# Patient Record
Sex: Female | Born: 1965 | Hispanic: No | Marital: Single | Smoking: Never smoker
Health system: Southern US, Community
[De-identification: ages and names within clinical notes are randomized; demographics above are authoritative.]

## PROBLEM LIST (undated history)

## (undated) DIAGNOSIS — H409 Unspecified glaucoma: Secondary | ICD-10-CM

## (undated) DIAGNOSIS — R109 Unspecified abdominal pain: Secondary | ICD-10-CM

## (undated) DIAGNOSIS — H5461 Unqualified visual loss, right eye, normal vision left eye: Secondary | ICD-10-CM

## (undated) HISTORY — DX: Unspecified abdominal pain: R10.9

## (undated) HISTORY — DX: Unqualified visual loss, right eye, normal vision left eye: H54.61

## (undated) HISTORY — PX: GLAUCOMA SURGERY: SHX656

## (undated) HISTORY — DX: Unspecified glaucoma: H40.9

---

## 2015-11-26 ENCOUNTER — Encounter: Payer: Self-pay | Admitting: Student

## 2015-11-26 ENCOUNTER — Ambulatory Visit (INDEPENDENT_AMBULATORY_CARE_PROVIDER_SITE_OTHER): Payer: Medicaid Other | Admitting: Family Medicine

## 2015-11-26 VITALS — BP 126/86 | HR 75 | Temp 98.1°F | Ht 67.5 in | Wt 153.2 lb

## 2015-11-26 DIAGNOSIS — H409 Unspecified glaucoma: Secondary | ICD-10-CM | POA: Insufficient documentation

## 2015-11-26 DIAGNOSIS — R1011 Right upper quadrant pain: Secondary | ICD-10-CM | POA: Diagnosis not present

## 2015-11-26 DIAGNOSIS — R109 Unspecified abdominal pain: Secondary | ICD-10-CM

## 2015-11-26 DIAGNOSIS — H5461 Unqualified visual loss, right eye, normal vision left eye: Secondary | ICD-10-CM | POA: Diagnosis not present

## 2015-11-26 DIAGNOSIS — Z7689 Persons encountering health services in other specified circumstances: Secondary | ICD-10-CM

## 2015-11-26 DIAGNOSIS — G8929 Other chronic pain: Secondary | ICD-10-CM | POA: Diagnosis not present

## 2015-11-26 DIAGNOSIS — Z7189 Other specified counseling: Secondary | ICD-10-CM | POA: Diagnosis present

## 2015-11-26 MED ORDER — DORZOLAMIDE HCL-TIMOLOL MAL 2-0.5 % OP SOLN: 1.0000 [drp] | Freq: Two times a day (BID) | OPHTHALMIC | Status: DC

## 2015-11-26 MED ORDER — DORZOLAMIDE HCL-TIMOLOL MAL 2-0.5 % OP SOLN: 1.0000 [drp] | Freq: Two times a day (BID) | OPHTHALMIC | 12 refills | Status: DC
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
## 2015-11-26 NOTE — Patient Instructions (Signed)
It was very good to meet you today!  Come back to see us in 4 weeks.  We will refer you to eye doctor. They will call you to schedule an appointment in one to two weeks.   We will also look in to your right flank pain when you comeback and see us. We may do some tests at that time

## 2015-12-10 ENCOUNTER — Encounter: Payer: Self-pay | Admitting: Family Medicine

## 2015-12-10 NOTE — Progress Notes (Signed)
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 Patient has been referred to TB clinic. We'll need to follow-up and next visit to ensure that patient has kept this appointment.  Will forward to PCP.

## 2015-12-23 ENCOUNTER — Encounter: Payer: Self-pay | Admitting: Student

## 2015-12-23 ENCOUNTER — Ambulatory Visit (INDEPENDENT_AMBULATORY_CARE_PROVIDER_SITE_OTHER): Payer: Medicaid Other | Admitting: Student

## 2015-12-23 VITALS — BP 118/77 | HR 76 | Temp 97.8°F | Ht 68.0 in | Wt 154.0 lb

## 2015-12-23 DIAGNOSIS — H4053X0 Glaucoma secondary to other eye disorders, bilateral, stage unspecified: Secondary | ICD-10-CM

## 2015-12-23 DIAGNOSIS — R109 Unspecified abdominal pain: Secondary | ICD-10-CM

## 2015-12-23 DIAGNOSIS — H5461 Unqualified visual loss, right eye, normal vision left eye: Secondary | ICD-10-CM

## 2015-12-23 DIAGNOSIS — Z23 Encounter for immunization: Secondary | ICD-10-CM

## 2015-12-23 DIAGNOSIS — Z114 Encounter for screening for human immunodeficiency virus [HIV]: Secondary | ICD-10-CM

## 2015-12-23 DIAGNOSIS — R7611 Nonspecific reaction to tuberculin skin test without active tuberculosis: Secondary | ICD-10-CM

## 2015-12-23 DIAGNOSIS — G8929 Other chronic pain: Secondary | ICD-10-CM

## 2015-12-23 MED ORDER — DOCUSATE SODIUM 100 MG PO CAPS: 100.0000 mg | ORAL_CAPSULE | Freq: Two times a day (BID) | ORAL | 0 refills | Status: DC

## 2015-12-23 MED ORDER — POLYETHYLENE GLYCOL 3350 17 GM/SCOOP PO POWD: 17.0000 g | Freq: Two times a day (BID) | ORAL | 1 refills | Status: DC | PRN

## 2015-12-23 NOTE — Progress Notes (Signed)
   Subjective:    Patient ID: Carrie Rivas is a 50 y.o. old female. Arabic interprator  HPI #Right flank pain: This is a chronic issue. It has been going on for 3 years. No recent changes. She describes the pain as sore. No association with meals. Pain usually worse with prolonged standing. She denies urinary symptoms or history of hematuria. No constitutional symptoms either. No history of trauma.   #Consitpation: BM every 3-4 days. Reports straining. This has been going on for 4 month. She stated that Rice is a major part of the daily meal. She reports some vegetables and fruits as well.   #Postive PPD: Received a message from has departments that states her PPD was positive and patient was referred to TB clinic. Patient is not aware of this result. She states that she has an appointment at health department this month department. However, she doesn't know the dates of the reason for the appointment.   PMH: reviewed  SH: Denies smoking or drinking  Review of Systems Per HPI Objective:   Vitals:   12/23/15 1545  BP: 118/77  Pulse: 76  Temp: 97.8 F (36.6 C)  TempSrc: Oral  Weight: 154 lb (69.9 kg)  Height: 5\' 8"  (1.727 m)    GEN: appears well, no apparent distress. Oropharynx: mmm without erythema or exudation CVS: RRR, normal s1 and s2, no murmurs, no edema RESP: no increased work of breathing, good air movement bilaterally, no crackles or wheeze GI: Bowel sounds present and normal, soft, non-distended. She is tender to palpation over right lower quadrant. No rebound tenderness. Murphy's sign negative NEURO: alert and oriented appropriately, no gross defecits  PSYCH: appropriate mood and affect     Assessment & Plan:  Right flank pain, chronic Unclear etiology. No urinary symptoms to suggest UTI. She has no vaginal discharge or vaginal bleeding. No constitutional symptoms to suspect infectious process or malignancy. The chronic nature of her pain and tenderness to  palpation over RLQ is concerning to me. We will get a BMP. If his creatinine is normal, I'll get CT abdomen and pelvis with contrast.  Glaucoma Patient to see ophthalmologist. Appointments has been made. She was given information she needs about his appointment.  Vision loss of right eye See plan above  Positive PPD Called health department and left a voicemail twice for clarification as patient has no clue about this.

## 2015-12-23 NOTE — Patient Instructions (Signed)
It was great seeing you today! We have addressed the following issues today  1. Glaucoma: Please go to your appointment with the eye doctor 2. Right flank pain: I'm checking her blood today. We may consider about doing an imaging depending on what your blood work shows.  3. Constipation: I sent a prescription for Colace to the pharmacy. Please take as needed for constipation. If no improvement, I have also sent a prescription for MiraLAX that you can use as needed. I also recommend eating diets rich in fibers such as vegetables, whole grain bread and fresh fruits. I also recommend cut down on rice.  4.   TB skin test: I strongly recommend that you follow-up with health department about this.     If we did any lab work today, and the results require attention, either me or my nurse will get in touch with you. If everything is normal, you will get a letter in mail. If you don't hear from us in two weeks, please give us a call. Otherwise, I look forward to talking with you again at our next visit. If you have any questions or concerns before then, please call the clinic at [phone].  Please bring all your medications to every doctors visit   Sign up for My Chart to have easy access to your labs results, and communication with your Primary care physician.    Please check-out at the front desk before leaving the clinic.   Take Care,

## 2015-12-24 ENCOUNTER — Telehealth: Payer: Self-pay | Admitting: Student

## 2015-12-24 ENCOUNTER — Other Ambulatory Visit: Payer: Self-pay | Admitting: Student

## 2015-12-24 DIAGNOSIS — G8929 Other chronic pain: Secondary | ICD-10-CM

## 2015-12-24 DIAGNOSIS — R109 Unspecified abdominal pain: Principal | ICD-10-CM

## 2015-12-24 DIAGNOSIS — R7611 Nonspecific reaction to tuberculin skin test without active tuberculosis: Secondary | ICD-10-CM | POA: Insufficient documentation

## 2015-12-24 LAB — HIV ANTIBODY (ROUTINE TESTING W REFLEX): HIV 1&2 Ab, 4th Generation: NONREACTIVE

## 2015-12-24 LAB — BASIC METABOLIC PANEL WITH GFR
BUN: 12 mg/dL (ref 7–25)
CO2: 20 mmol/L (ref 20–31)
Calcium: 9.5 mg/dL (ref 8.6–10.2)
Chloride: 105 mmol/L (ref 98–110)
Creat: 0.72 mg/dL (ref 0.50–1.10)
GLUCOSE: 84 mg/dL (ref 65–99)
POTASSIUM: 4 mmol/L (ref 3.5–5.3)
SODIUM: 138 mmol/L (ref 135–146)

## 2015-12-24 NOTE — Assessment & Plan Note (Signed)
See plan above.

## 2015-12-24 NOTE — Assessment & Plan Note (Signed)
Unclear etiology. No urinary symptoms to suggest UTI. She has no vaginal discharge or vaginal bleeding. No constitutional symptoms to suspect infectious process or malignancy. The chronic nature of her pain and tenderness to palpation over RLQ is concerning to me. We will get a BMP. If his creatinine is normal, I'll get CT abdomen and pelvis with contrast.

## 2015-12-24 NOTE — Assessment & Plan Note (Signed)
Called health department and left a voicemail twice for clarification as patient has no clue about this.

## 2015-12-24 NOTE — Assessment & Plan Note (Signed)
Patient to see ophthalmologist. Appointments has been made. She was given information she needs about his appointment.

## 2015-12-24 NOTE — Telephone Encounter (Signed)
Called patient using Arabic interpreter with ID [phone] to let her know that I am ordering a CT abdomen and pelvis with contrast for her right flank pain. Patient didn't answer the phone and the interpreter had to leave a voicemail. My staff will schedule the CT and call the patient again.

## 2015-12-27 ENCOUNTER — Telehealth: Payer: Self-pay

## 2015-12-27 NOTE — Telephone Encounter (Signed)
-----   Message from Almon Herculesaye T Gonfa, MD sent at 12/24/2015  2:52 PM EDT ----- Regarding: CT abdomen and Pelvis Hi,  I ordered a CT abdomen and pelvis for this patient. I tried to call her using Arabic interpreter. Unfortunately she didn't answer the phone and I had leave a voicemail. Please schedule CT and call the patient. Thanks! Alwyn Renaye

## 2015-12-27 NOTE — Telephone Encounter (Signed)
-----   Message from Taye T Gonfa, MD sent at 12/24/2015  2:52 PM EDT ----- Regarding: CT abdomen and Pelvis Hi,  I ordered a CT abdomen and pelvis for this patient. I tried to call her using Arabic interpreter. Unfortunately she didn't answer the phone and I had leave a voicemail. Please schedule CT and call the patient. Thanks! Taye 

## 2015-12-27 NOTE — Telephone Encounter (Signed)
I spoke to pt using pac[employer] interpre[employer] (arij, [phone]). Pt asked me to mail the address and any information needed for the appt. Information is being mailed today. Sunday SpillersSharon T Imraan Wendell, CMA

## 2015-12-27 NOTE — Telephone Encounter (Signed)
Called pt using [employer] (Arij, [phone]) I gave the pt the information but the pt asked me to mail her the address and all information she will need. Mailing the information today. Sunday SpillersSharon T Jasmina Gendron, CMA

## 2015-12-31 ENCOUNTER — Inpatient Hospital Stay: Admission: RE | Admit: 2015-12-31 | Payer: Medicaid Other | Source: Ambulatory Visit

## 2015-12-31 ENCOUNTER — Telehealth: Payer: Self-pay | Admitting: *Deleted

## 2015-12-31 NOTE — Telephone Encounter (Signed)
Carney BernJean from [facility] called stating patient did not show for CT scan today.  Clovis PuMartin, Tamika L, RN

## 2016-01-05 ENCOUNTER — Telehealth: Payer: Self-pay | Admitting: Student

## 2016-01-05 NOTE — Telephone Encounter (Signed)
After many trials, I finally managed to speak to Mrs. Gladden using [employer] interpreter with ID [phone] about the following issues: Missed CT scan:  Patient reports missing her appointment due to transportation issues. She says she used  to have a bus pass that has expired. We will try to reschedule a CT scan.   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 Patient states going to Health Department 2 days ago. She states they ([facility]) didn't have Arabic interpreter for her. She says she is now planning to go back either this week or next week. She says her brother will be in town and will drive her to [facility].

## 2016-01-13 ENCOUNTER — Encounter: Payer: Self-pay | Admitting: Student

## 2016-02-14 ENCOUNTER — Other Ambulatory Visit: Payer: Self-pay | Admitting: Infectious Disease

## 2016-02-14 ENCOUNTER — Ambulatory Visit
Admission: RE | Admit: 2016-02-14 | Discharge: 2016-02-14 | Disposition: A | Discharge status: Home / Self Care | Payer: Medicaid Other | Source: Ambulatory Visit | Attending: Infectious Disease | Admitting: Infectious Disease

## 2016-02-14 DIAGNOSIS — R7611 Nonspecific reaction to tuberculin skin test without active tuberculosis: Secondary | ICD-10-CM

## 2016-02-14 IMAGING — CR DG CHEST 1V
1 series · 1 of 1 positions shown · non-contrast
Comparison: None in PACs

CLINICAL DATA: Positive TB blood test.  Nonsmoker.

EXAM:
CHEST 1 VIEW

[w chest pa]
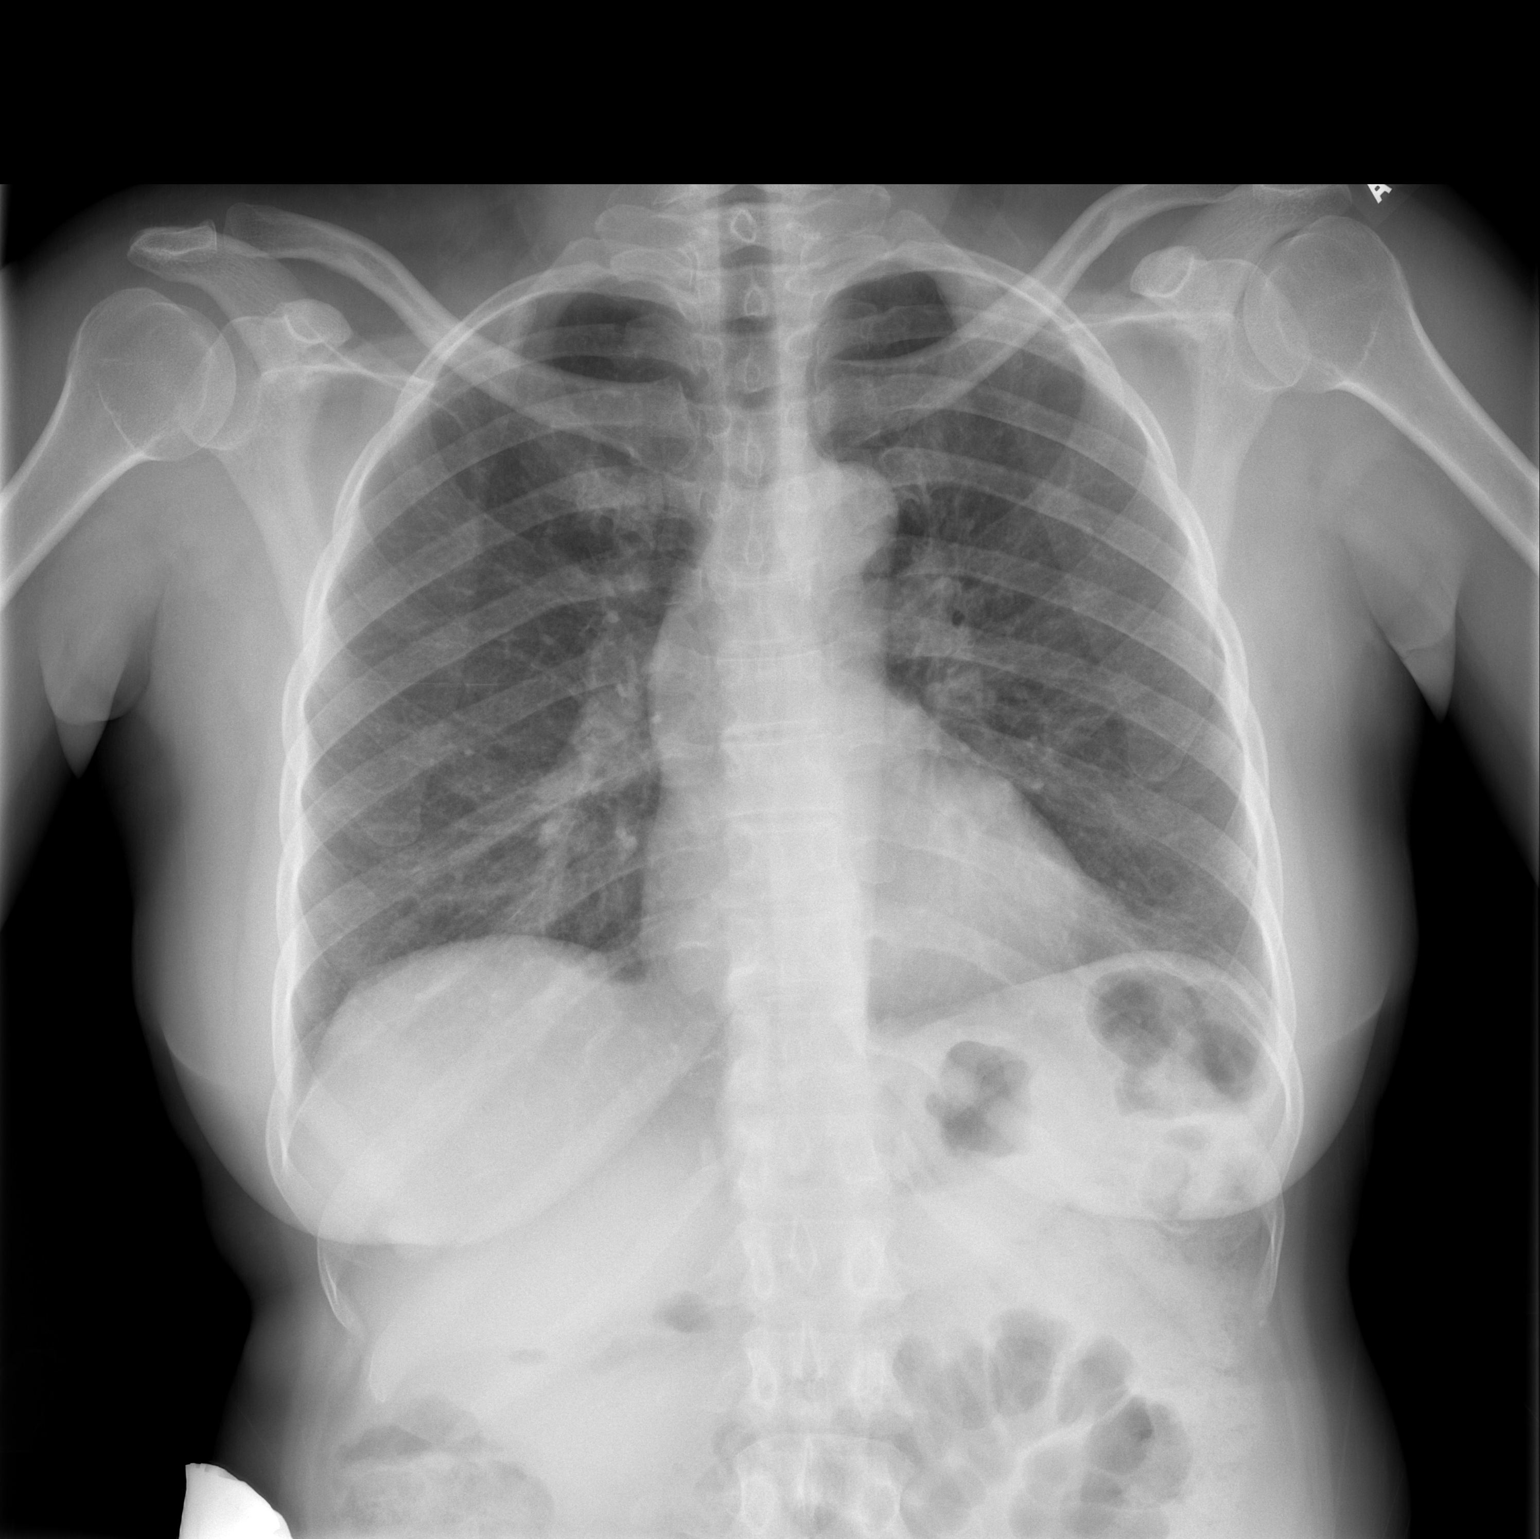

[1 of 1 positions shown; findings below may reference images not displayed]

FINDINGS: The lungs are adequately inflated. The interstitial markings are
mildly prominent. There is coarse infrahilar density on the right
which is nonspecific. There is no alveolar infiltrate nor pleural
effusion. The heart and pulmonary vascularity are normal. The
mediastinum is normal in width. No soft tissue density or calcified
lung nodules are observed. The observed bony thorax is unremarkable.
IMPRESSION: No definite evidence of acute or old tuberculous infection nor other
acute cardiopulmonary abnormality. However, coarse lung markings in
the right infrahilar region are noted. A PA and lateral chest x-ray
with deep inspiratory effort would be useful to exclude a small area
of parenchymal infiltrate or (less likely) a mass.

These results will be called to the ordering clinician or
representative by the Radiologist Assistant, and communication
documented in the PACS or zVision Dashboard.

## 2016-04-17 ENCOUNTER — Other Ambulatory Visit: Payer: Self-pay | Admitting: Infectious Disease

## 2016-04-17 ENCOUNTER — Ambulatory Visit
Admission: RE | Admit: 2016-04-17 | Discharge: 2016-04-17 | Disposition: A | Discharge status: Home / Self Care | Payer: Medicaid Other | Source: Ambulatory Visit | Attending: Infectious Disease | Admitting: Infectious Disease

## 2016-04-17 DIAGNOSIS — R7612 Nonspecific reaction to cell mediated immunity measurement of gamma interferon antigen response without active tuberculosis: Secondary | ICD-10-CM

## 2016-04-17 IMAGING — CR DG CHEST 2V
2 series · 2 of 2 positions shown · non-contrast
Comparison: Portable chest x-ray [DATE]

CLINICAL DATA: Positive QuantiFERON Gold test. No current chest
complaints.

EXAM:
CHEST  2 VIEW

[w chest pa]
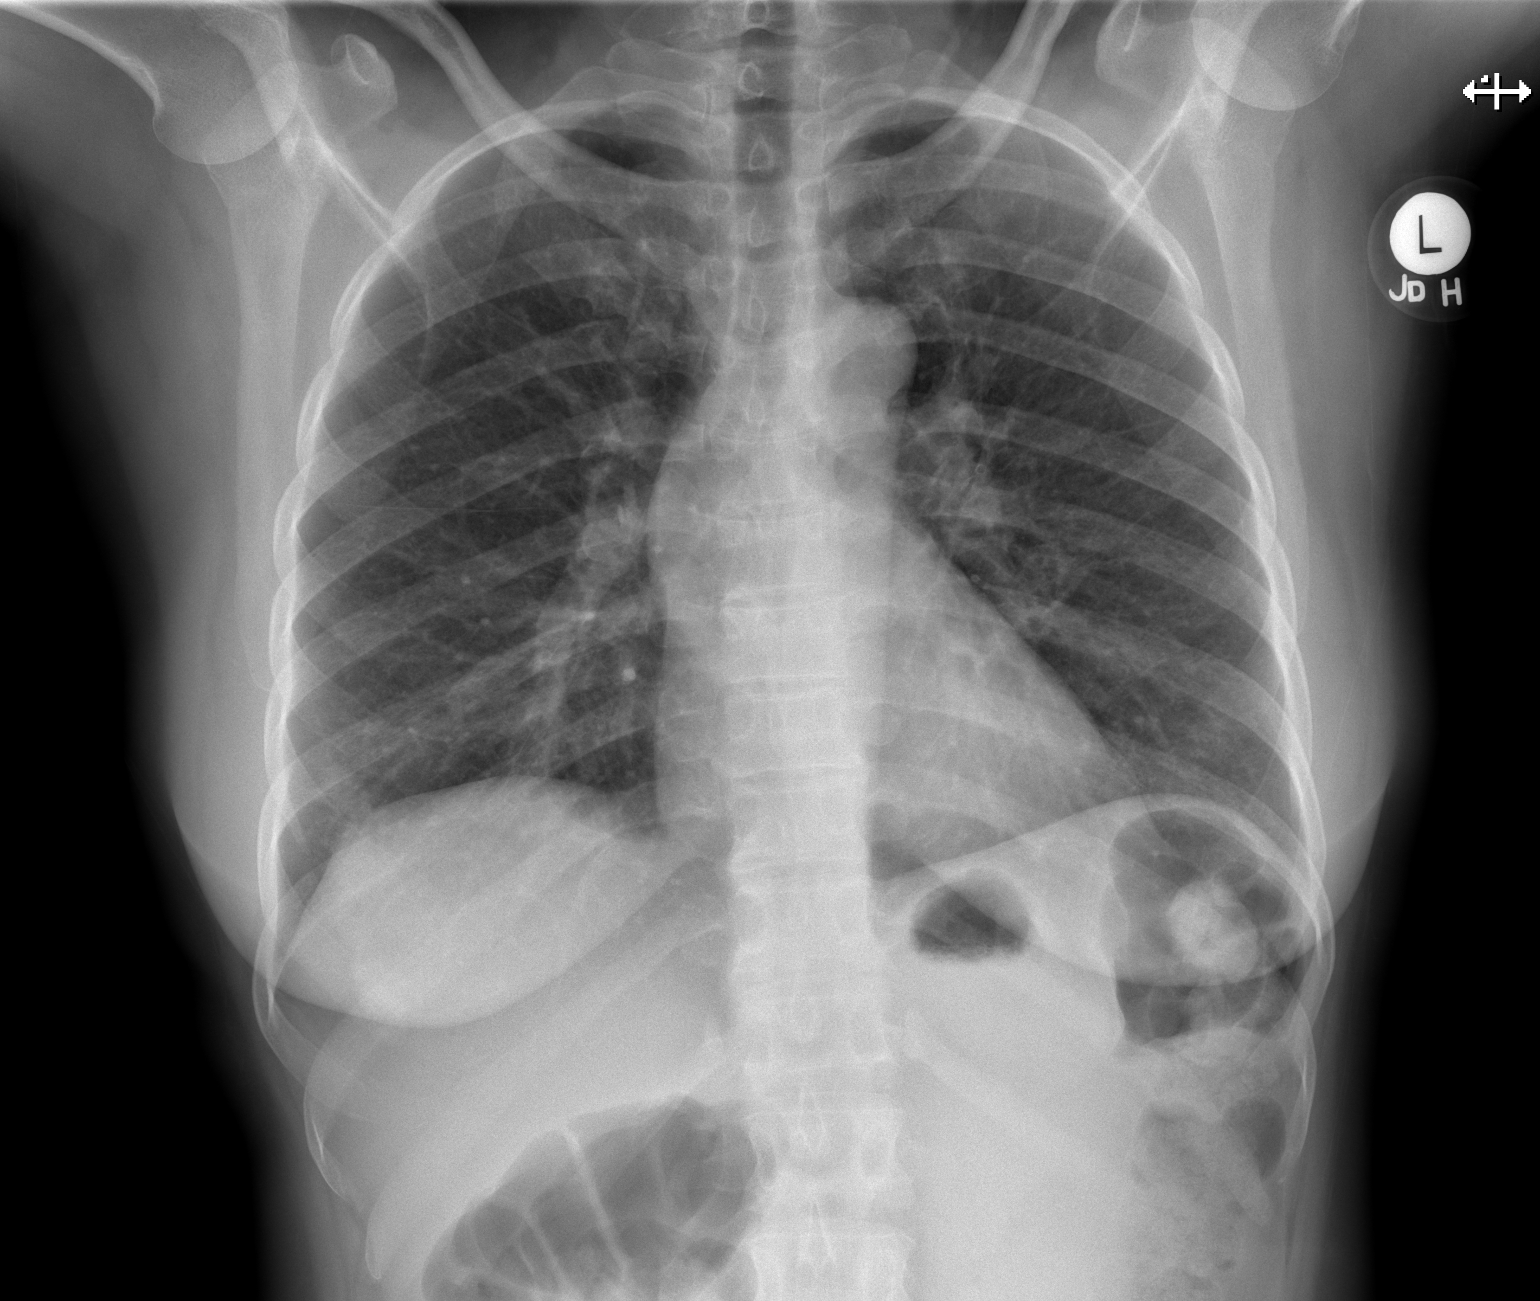

[w chest lat]
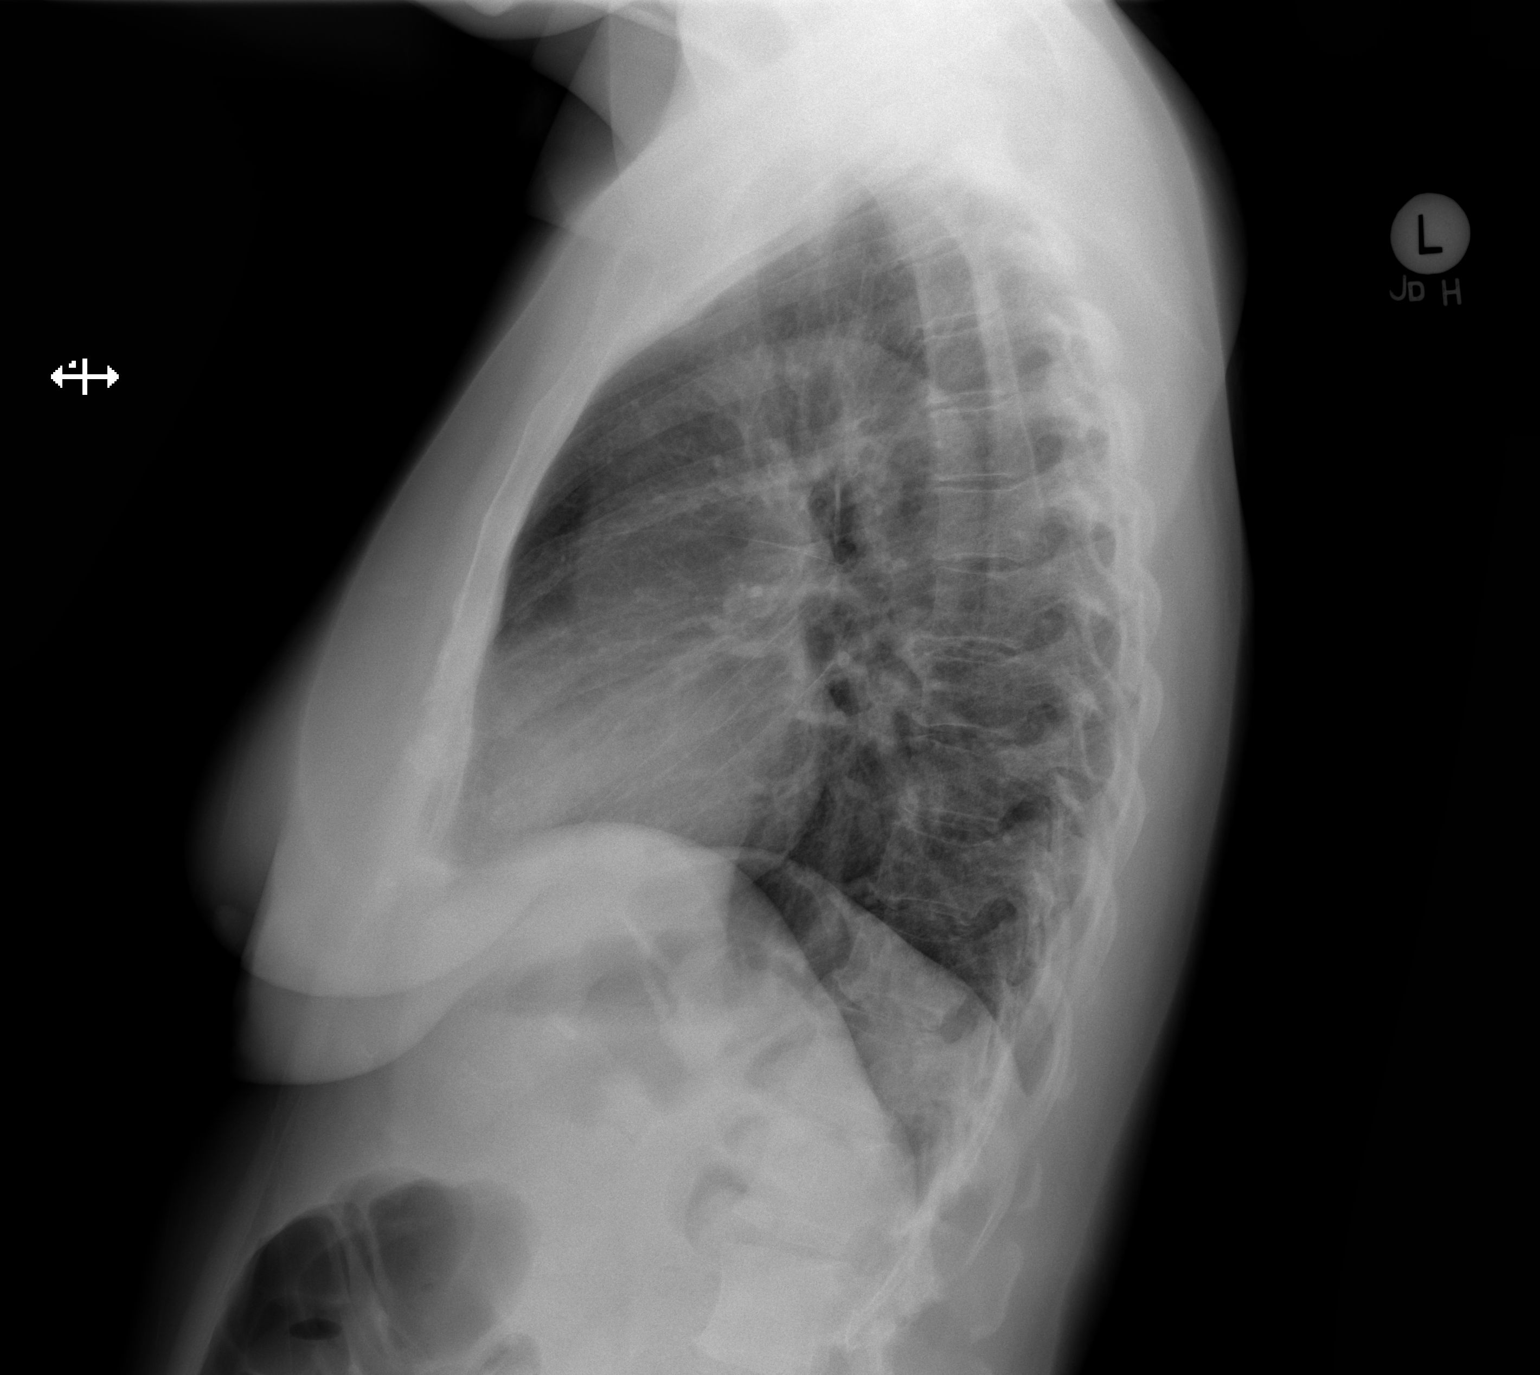

[2 of 2 positions shown; findings below may reference images not displayed]

FINDINGS: The lungs are adequately inflated. There is no focal infiltrate.
There is no pleural effusion. No cavitary nodules are observed. No
calcified granulomas are demonstrated. The heart and pulmonary
vascularity are normal. The mediastinum is normal in width. The bony
thorax is unremarkable.
IMPRESSION: There is no evidence of acute or old tuberculous infection nor other
acute cardiopulmonary abnormality.

## 2017-08-27 DIAGNOSIS — G5601 Carpal tunnel syndrome, right upper limb: Secondary | ICD-10-CM | POA: Insufficient documentation

## 2018-01-09 ENCOUNTER — Other Ambulatory Visit: Payer: Self-pay

## 2018-01-09 ENCOUNTER — Encounter: Payer: Self-pay | Admitting: Student in an Organized Health Care Education/Training Program

## 2018-01-09 ENCOUNTER — Ambulatory Visit (INDEPENDENT_AMBULATORY_CARE_PROVIDER_SITE_OTHER): Payer: Self-pay | Admitting: Student in an Organized Health Care Education/Training Program

## 2018-01-09 VITALS — BP 110/80 | HR 70 | Temp 97.9°F | Ht 68.0 in | Wt 149.0 lb

## 2018-01-09 DIAGNOSIS — T7840XA Allergy, unspecified, initial encounter: Secondary | ICD-10-CM

## 2018-01-09 DIAGNOSIS — Z23 Encounter for immunization: Secondary | ICD-10-CM

## 2018-01-09 MED ORDER — FLUTICASONE PROPIONATE 50 MCG/ACT NA SUSP: 2.0000 | Freq: Every day | NASAL | 6 refills | Status: DC

## 2018-01-09 MED ORDER — CETIRIZINE HCL 10 MG PO TABS: 10.0000 mg | ORAL_TABLET | Freq: Every day | ORAL | 11 refills | Status: DC

## 2018-01-09 NOTE — Progress Notes (Signed)
   Subjective:    Carrie Rivas - 52 y.o. female MRN [id number]  Date of birth: 09-14-1965  HPI  Carrie Rivas is here for *rhinorrhea and congestion.  History obtained with the help of the patient's daughter who translates.  Rhinorrhea and sneezing Patient endorses 1 month of rhinorrhea and congestion.  Symptoms have been every day.  She has attempted over-the-counter cold remedies, however she is uncertain of the name of these medications.  She endorses sneezing.  She has not had any cough.  She has had subjective fevers but has not tested her temperature.  She does not take any allergy medicines.  No N/B/D/C.  No chest pain or difficulty breathing.  No urinary symptoms.  No sick contacts that she knows of.  Health Maintenance:   Health Maintenance Due  Topic Date Due  . TETANUS/TDAP  03/20/1986  . PAP SMEAR  03/20/1988  . MAMMOGRAM  03/20/2017  . COLONOSCOPY  03/20/2017  . INFLUENZA VACCINE  10/18/2017    -  reports that she has never smoked. She has never used smokeless tobacco. - Review of Systems: Per HPI. - Past Medical History: Patient Active Problem List   Diagnosis Date Noted  . 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 Glaucoma 11/26/2015  . 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 Right flank pain, chronic 11/26/2015   - Medications: reviewed and updated Current Outpatient Medications  Medication Sig Dispense Refill  . docusate sodium (COLACE) 100 MG capsule Take 1 capsule (100 mg total) by mouth 2 (two) times daily. 10 capsule 0  . dorzolamide-timolol (COSOPT) 22.3-6.8 MG/ML ophthalmic solution Place 1 drop into both eyes 2 (two) times daily. 10 mL 12  . polyethylene glycol powder (GLYCOLAX/MIRALAX) powder Take 17 g by mouth 2 (two) times daily as needed. 3350 g 1   No current facility-administered medications for this visit.     Review of Systems See HPI     Objective:   Physical Exam BP 110/80   Pulse 70   Temp 97.9 F (36.6 C) (Oral)   Ht 5\' 8"  (1.727 m)   Wt  149 lb (67.6 kg)   SpO2 99%   BMI 22.66 kg/m  Gen: NAD, alert, cooperative with exam, well-appearing HEENT: NCAT, PERRL, clear conjunctiva, oropharynx without edema or exudate, supple neck CV: RRR, good S1/S2, no murmur, no edema, capillary refill brisk  Resp: CTABL, no wheezes, non-labored Abd: SNTND, BS present, no guarding or organomegaly Skin: no rashes, normal turgor  Neuro: no gross deficits.  Psych: good insight, alert and oriented     Assessment & Plan:   1.  Allergic rhinitis - symptoms are not consistent with a infection because they have persisted for 30 days.  She is afebrile and well-appearing in the office today.  Recommend Flonase and Zyrtec.  These prescriptions were sent to her pharmacy.  Reasons to return to care were discussed.   Howard Pouch, MD,MS,  PGY3 01/09/2018 2:31 PM

## 2018-01-09 NOTE — Patient Instructions (Signed)
It was a pleasure seeing you today in our clinic.   Our clinic's number is [phone]. Please call with questions or concerns about what we discussed today.  Be well, Dr. Laken Rog   

## 2018-02-01 ENCOUNTER — Encounter: Payer: Self-pay | Admitting: Student in an Organized Health Care Education/Training Program

## 2018-03-20 DIAGNOSIS — U071 COVID-19: Secondary | ICD-10-CM

## 2018-03-20 HISTORY — DX: COVID-19: U07.1

## 2018-12-23 ENCOUNTER — Ambulatory Visit (INDEPENDENT_AMBULATORY_CARE_PROVIDER_SITE_OTHER): Payer: BC Managed Care – PPO | Admitting: Family Medicine

## 2018-12-23 ENCOUNTER — Other Ambulatory Visit: Payer: Self-pay

## 2018-12-23 ENCOUNTER — Encounter: Payer: Self-pay | Admitting: Family Medicine

## 2018-12-23 VITALS — BP 118/80 | HR 68 | Wt 156.4 lb

## 2018-12-23 DIAGNOSIS — R231 Pallor: Secondary | ICD-10-CM | POA: Insufficient documentation

## 2018-12-23 DIAGNOSIS — Z0000 Encounter for general adult medical examination without abnormal findings: Secondary | ICD-10-CM

## 2018-12-23 DIAGNOSIS — Z23 Encounter for immunization: Secondary | ICD-10-CM | POA: Diagnosis not present

## 2018-12-23 DIAGNOSIS — R5383 Other fatigue: Secondary | ICD-10-CM | POA: Diagnosis not present

## 2018-12-23 DIAGNOSIS — J302 Other seasonal allergic rhinitis: Secondary | ICD-10-CM

## 2018-12-23 DIAGNOSIS — Z780 Asymptomatic menopausal state: Secondary | ICD-10-CM

## 2018-12-23 MED ORDER — CETIRIZINE HCL 10 MG PO TABS: 10.0000 mg | ORAL_TABLET | Freq: Every day | ORAL | 11 refills | Status: DC

## 2018-12-23 NOTE — Assessment & Plan Note (Addendum)
Patient initially with significant pallor of nail bed.  Also coupled with pallor of oral mucosa.  Will get CBC to evaluate for anemia, also drew TSH.  Unclear possible Raynaud's phenomenon contributing to this.  Recommended wearing gloves and keeping hands warm outside in cold weather, and at work.

## 2018-12-23 NOTE — Progress Notes (Signed)
  Patient's daughter acted as an Radio broadcast assistant throughout entirety of visit  HPI 53 year old female who presents for "fever in body", body aches, sneezing.  The patient describes the fever as intermittent sweating, and feeling overheated.  States this is been going on for about a year.  States that last menstrual cycle was around 2 or 3 months ago, and they have been very irregular over the last year.  She states that these "hot flashes" happen even if other people in the room are cold.  Patient describes very mild nighttime sleeping disturbance, but otherwise just the temperature sensation is her main complaint.  Patient gets seasonal allergies around this time of the year routinely.  They are typically characterized by itching, sneezing, and mild congestion.  She states that in the past a tablet has been helpful for reducing the symptoms.  She states that at some point she was also on a intranasal spray, but this did not help as much.  She is requesting a refill of this medication.  Of note the patient complains of cold hands intermittently.  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  Initially unable to get a pulse ox on this patient, but her hands warmed up over time.  She has not had a CBC or TSH within the last 3 years.  CC: Hot flashes, allergies   ROS:   Review of Systems See HPI for ROS.   CC, SH/smoking status, and VS noted  Objective: BP 118/80   Pulse 68   Wt 156 lb 6.4 oz (70.9 kg)   LMP 10/19/2018 (Approximate)   SpO2 100%   BMI 23.78 kg/m  Gen: 53 year old female, no acute distress, very comfortable in room HEENT: Pale oral mucosa, EOMI, PERRLA. CV: Regular rate rhythm, no M/R/G Resp: Lungs clear to auscultation bilaterally, no accessory muscle use Neuro: Alert and oriented, Speech clear, No gross deficits Bilateral hands: Marked nailbed paleness, initially pale at tips of fingers but this normalized over the visit.    Assessment and plan:  Pallor of nail bed Patient initially with significant pallor of nail bed.  Also coupled with pallor of oral mucosa.  Will get CBC to evaluate for anemia, also drew TSH.  Unclear possible Raynaud's phenomenon contributing to this.  Recommended wearing gloves and keeping hands warm outside in cold weather, and at work.  Seasonal allergies Patient is itching and sneezing consistent with seasonal allergies.  Has apparently had this in the past and Zyrtec is been quite effective for this.  Sent in Zyrtec 10 mg.  Healthcare maintenance Flu vaccine given at today's appointment  Menopause Patient with metorrhagia as well as symptoms consistent with hot flashes.  Likely undergoing menopause.  Patient not interested in trying any treatments for this at this time.  If change her mind can follow-up with PCP.  Could consider SSRI/SNRI.   Orders Placed This Encounter  Procedures  . Flu Vaccine QUAD 36+ mos IM  . CBC with Differential  . TSH    Meds ordered this encounter  Medications  . cetirizine (ZYRTEC) 10 MG tablet    Sig: Take 1 tablet (10 mg total) by mouth daily.    Dispense:  30 tablet    Refill:  11     Guadalupe Dawn MD PGY-3 Family Medicine Resident  12/23/2018 10:50 AM

## 2018-12-23 NOTE — Assessment & Plan Note (Signed)
Flu vaccine given at today's appointment

## 2018-12-23 NOTE — Assessment & Plan Note (Signed)
Patient with metorrhagia as well as symptoms consistent with hot flashes.  Likely undergoing menopause.  Patient not interested in trying any treatments for this at this time.  If change her mind can follow-up with PCP.  Could consider SSRI/SNRI.

## 2018-12-23 NOTE — Assessment & Plan Note (Signed)
Patient is itching and sneezing consistent with seasonal allergies.  Has apparently had this in the past and Zyrtec is been quite effective for this.  Sent in Zyrtec 10 mg.

## 2018-12-23 NOTE — Patient Instructions (Addendum)
It was great meeting you guys today!  I think that the swelling and overall warm feeling that you are having every now and then are due to hot flashes.  This coupled with not having a menstrual cycle for a couple of months most likely means that you are in menopause right now. Unfortunately there is not much that can be done to safely decrease your symptoms.  Your sneezing and itching are likely due to seasonal allergies.  Very common this time of the year as the weather gets little colder.  It sounds like zyrtec worked well for you in the past so I will send this into your pharmacy.  More concerning to me is your cold fingers and general paleness of your nailbeds.  I would like to get a thyroid level as well as a check of your blood counts to see make sure you are not really anemic.  I will give you a call with these results and they come back in a day or 2.  Also gave you a flu shot today -----------------------------------------------------------------------------------------------------------               .                 .             .           .             .   zyrtec           .           .                  .           2.     laqad kan liqa'ukum rayieaan alyawm ya rfaq! 'aetaqid 'ana altawarum walshueur alddafi aleama aldhy tasheur bih bayn alhin walakhar natij ean alhibat alssakhinati. hdha 'iilaa janb edm wujud aldawrat alshahriat limudat shahrayn yaeni ealaa al'arjah  'anak fi sini alyas alan. lisu' alhazi , la yujad alkthyr aldhy ymkn alqiam bih litaqlil al'aerad bi'amanin. min almhtml 'an yakun sbb aleats walhukat bsbb alhasasiat almusmiati. shayie jdana fi hadha alwaqt min aleam hayth yusbih altaqs 'akthar burudat qlylaan. ybdw 'ana zyrteceamilat bishakl jayid balnsbt lk fi almadi , lidha sa'arsiluha 'iilaa sidlitk. 'akthar ma yuqaliquni hu burudat 'asabaeak washuhub 'azafirik bishakl eamin. 'awada alhusul ealaa mustawaa alghadat aldaraqiat wakadhalik fahs taedad aldam lilta'akud min 'anak last msabana bifaqr aldam hqana. sawf 'atasil bik bihadhih alnatayij wasataeud fi ghdwn yawm 'aw 2. wa'aetayk liqah alanflwnza alyawm

## 2018-12-24 LAB — CBC WITH DIFFERENTIAL/PLATELET
Basophils Absolute: 0 10*3/uL (ref 0.0–0.2)
Basos: 1 %
EOS (ABSOLUTE): 0.1 10*3/uL (ref 0.0–0.4)
Eos: 3 %
Hematocrit: 38.8 % (ref 34.0–46.6)
Hemoglobin: 13.3 g/dL (ref 11.1–15.9)
Immature Grans (Abs): 0 10*3/uL (ref 0.0–0.1)
Immature Granulocytes: 0 %
Lymphocytes Absolute: 2 10*3/uL (ref 0.7–3.1)
Lymphs: 59 %
MCH: 30.6 pg (ref 26.6–33.0)
MCHC: 34.3 g/dL (ref 31.5–35.7)
MCV: 89 fL (ref 79–97)
Monocytes Absolute: 0.3 10*3/uL (ref 0.1–0.9)
Monocytes: 8 %
Neutrophils Absolute: 1 10*3/uL — ABNORMAL LOW (ref 1.4–7.0)
Neutrophils: 29 %
Platelets: 261 10*3/uL (ref 150–450)
RBC: 4.35 x10E6/uL (ref 3.77–5.28)
RDW: 12.3 % (ref 11.7–15.4)
WBC: 3.3 10*3/uL — ABNORMAL LOW (ref 3.4–10.8)

## 2018-12-24 LAB — TSH: TSH: 2.8 u[IU]/mL (ref 0.450–4.500)

## 2019-01-02 ENCOUNTER — Telehealth: Payer: Self-pay | Admitting: Family Medicine

## 2019-01-02 NOTE — Telephone Encounter (Signed)
Patient is requesting lab results from a couple of weeks ago please, call her at [phone].

## 2019-08-08 ENCOUNTER — Ambulatory Visit: Payer: BC Managed Care – PPO | Admitting: Family Medicine

## 2019-08-11 ENCOUNTER — Encounter (HOSPITAL_COMMUNITY): Payer: Self-pay

## 2019-08-11 ENCOUNTER — Ambulatory Visit (HOSPITAL_COMMUNITY)
Admission: EM | Admit: 2019-08-11 | Discharge: 2019-08-11 | Disposition: A | Discharge status: Home / Self Care | Payer: BC Managed Care – PPO | Attending: Family Medicine | Admitting: Family Medicine

## 2019-08-11 ENCOUNTER — Other Ambulatory Visit: Payer: Self-pay

## 2019-08-11 DIAGNOSIS — M25552 Pain in left hip: Secondary | ICD-10-CM | POA: Diagnosis not present

## 2019-08-11 MED ORDER — LIDOCAINE HCL (PF) 1 % IJ SOLN
INTRAMUSCULAR | Status: AC
  Filled 2019-08-11: qty 30

## 2019-08-11 MED ORDER — PENTAFLUOROPROP-TETRAFLUOROETH EX AERO
INHALATION_SPRAY | CUTANEOUS | Status: AC
  Filled 2019-08-11: qty 116

## 2019-08-11 MED ORDER — METHYLPREDNISOLONE ACETATE 40 MG/ML IJ SUSP
INTRAMUSCULAR | Status: AC
  Filled 2019-08-11: qty 1

## 2019-08-11 NOTE — Discharge Instructions (Addendum)
Please try ice  Please try the exercises  Please follow up with sports medicine if the pain continues

## 2019-08-11 NOTE — ED Provider Notes (Signed)
[facility]    CSN: [id number] Arrival date & time: 08/11/19  1819      History   Chief Complaint Chief Complaint  Patient presents with  . Hip Pain    HPI Carrie Rivas is a 54 y.o. female.   She is presenting with left lateral hip pain.  Denies any injury.  Symptoms seem to be worse at the end of the day.  She has not tried anything for the pain.  Denies any radicular symptoms.  Pain is sharp and stabbing in nature.  HPI  Past Medical History:  Diagnosis Date  . Glaucoma   . Right flank pain   . Vision loss of right eye     Patient Active Problem List   Diagnosis Date Noted  . Pallor of nail bed 12/23/2018  . Seasonal allergies 12/23/2018  . Healthcare maintenance 12/23/2018  . Menopause 12/23/2018  . 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 Glaucoma 11/26/2015  . 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 Right flank pain, chronic 11/26/2015    Past Surgical History:  Procedure Laterality Date  . GLAUCOMA SURGERY  In 11/2014   with subsequent loss of vision in right eye    OB History   No obstetric history on file.      Home Medications    Prior to Admission medications   Medication Sig Start Date End Date Taking? Authorizing Provider  cetirizine (ZYRTEC) 10 MG tablet Take 1 tablet (10 mg total) by mouth daily. 12/23/18   Myrene Buddy, MD  docusate sodium (COLACE) 100 MG capsule Take 1 capsule (100 mg total) by mouth 2 (two) times daily. 12/23/15   Almon Hercules, MD  dorzolamide-timolol (COSOPT) 22.3-6.8 MG/ML ophthalmic solution Place 1 drop into both eyes 2 (two) times daily. 11/26/15   Tobey Grim, MD  fluticasone (FLONASE) 50 MCG/ACT nasal spray Place 2 sprays into both nostrils daily. 01/09/18   Howard Pouch, MD  polyethylene glycol powder (GLYCOLAX/MIRALAX) powder Take 17 g by mouth 2 (two) times daily as needed. 12/23/15   Almon Hercules, MD    Family History History reviewed. No pertinent family history.  Social History Social History    Tobacco Use  . Smoking status: Never Smoker  . Smokeless tobacco: Never Used  Substance Use Topics  . Alcohol use: No  . Drug use: No     Allergies   Patient has no known allergies.   Review of Systems Review of Systems  See HPI   Physical Exam Triage Vital Signs ED Triage Vitals [08/11/19 1921]  Enc Vitals Group     BP 122/85     Pulse Rate 80     Resp 16     Temp 98.2 F (36.8 C)     Temp Source Oral     SpO2 100 %     Weight      Height      Head Circumference      Peak Flow      Pain Score      Pain Loc      Pain Edu?      Excl. in GC?    No data found.  Updated Vital Signs BP 122/85 (BP Location: Left Arm)   Pulse 80   Temp 98.2 F (36.8 C) (Oral)   Resp 16   SpO2 100%   Visual Acuity Right Eye Distance:   Left Eye Distance:   Bilateral Distance:    Right Eye Near:   Left  Eye Near:    Bilateral Near:     Physical Exam Gen: NAD, alert, cooperative with exam, well-appearing ENT: normal lips, normal nasal mucosa,  Skin: no rashes, no areas of induration  Neuro: normal tone, normal sensation to touch Psych:  normal insight, alert and oriented MSK:  Left hip: Tenderness to palpation of the greater trochanter. Normal internal and external rotation of the hip. Normal strength resistance with hip flexion. Negative straight leg raise. Positive FADIR  Neurovascularly intact   UC Treatments / Results  Labs (all labs ordered are listed, but only abnormal results are displayed) Labs Reviewed - No data to display  EKG   Radiology No results found.  Procedures Procedures (including critical care time)   Aspiration/Injection Procedure Note Carrie Rivas April 21, 1965  Procedure: Injection Indications: Left hip pain  Procedure Details Consent: Risks of procedure as well as the alternatives and risks of each were explained to the (patient/caregiver).  Consent for procedure obtained. Time Out: Verified patient identification,  verified procedure, site/side was marked, verified correct patient position, special equipment/implants available, medications/allergies/relevent history reviewed, required imaging and test results available.  Performed.  The area was cleaned with iodine and alcohol swabs.    The left greater trochanteric bursa was injected using 1 cc's of 40 mg Depo-Medrol and 4 cc's of 1% lidocaine with a 22 1 1/2" needle.     A sterile dressing was applied.  Patient did tolerate procedure well.    Medications Ordered in UC Medications - No data to display  Initial Impression / Assessment and Plan / UC Course  I have reviewed the triage vital signs and the nursing notes.  Pertinent labs & imaging results that were available during my care of the patient were reviewed by me and considered in my medical decision making (see chart for details).    Carrie Rivas is a 54 year old female that is presenting with left lateral hip pain.  Seems greater trochanteric in nature.  Injection was performed today.  Counseled on supportive care.  Given indications on follow-up.  Provided work note.  Final Clinical Impressions(s) / UC Diagnoses   Final diagnoses:  Greater trochanteric pain syndrome of left lower extremity     Discharge Instructions     Please try ice  Please try the exercises  Please follow up with sports medicine if the pain continues     ED Prescriptions    None     PDMP not reviewed this encounter.   Rosemarie Ax, MD 08/11/19 2122

## 2019-08-11 NOTE — ED Triage Notes (Signed)
Pt presents with left hip pain that radiates down left thigh not injury related X 1 month.

## 2019-08-21 ENCOUNTER — Ambulatory Visit (INDEPENDENT_AMBULATORY_CARE_PROVIDER_SITE_OTHER): Payer: BC Managed Care – PPO | Admitting: Family Medicine

## 2019-08-21 ENCOUNTER — Other Ambulatory Visit: Payer: Self-pay

## 2019-08-21 ENCOUNTER — Encounter: Payer: Self-pay | Admitting: Family Medicine

## 2019-08-21 VITALS — BP 110/80 | HR 80 | Ht 68.0 in | Wt 158.0 lb

## 2019-08-21 DIAGNOSIS — M25562 Pain in left knee: Secondary | ICD-10-CM

## 2019-08-21 DIAGNOSIS — M25552 Pain in left hip: Secondary | ICD-10-CM

## 2019-08-21 MED ORDER — GABAPENTIN 100 MG PO CAPS: 100.0000 mg | ORAL_CAPSULE | Freq: Three times a day (TID) | ORAL | 0 refills | Status: DC

## 2019-08-21 NOTE — Assessment & Plan Note (Signed)
Along with the left hip pain patient is also complaining of left knee pain. She is tender to palpation on posterior aspect of knee. Pain with flexion and extension of the knee no erythema or edema. It is difficult to determine if the pain is radiating from the hip to the knee or if it is from the knee to the hip. -Same recommendations as above regarding NSAIDs, ice, heat as well as Neurontin -Two-view x-ray of knee to assess for anatomical issues

## 2019-08-21 NOTE — Patient Instructions (Signed)
It was a pleasure to meet you today.  I am sorry you are still having your hip and knee pain.  I would like to get some x-rays of your lower back, hip, knee.  I have put orders in for that and you will need to go to [facility] imaging for those x-rays.  I would like for you to continue to rest your hip and I have also prescribed a medication called gabapentin for possible nerve pain.  I would also like you to try ibuprofen 400 mg every 6-8 hours.  I would like to see you back in 2 weeks and we can go over the x-rays and check out your pain.  I have you have a wonderful afternoon!

## 2019-08-21 NOTE — Assessment & Plan Note (Signed)
Patient reports left hip pain which radiates to the knee and down to the ankle with ambulation over the past 2 weeks. She had an injection in the trochanteric bursa with no relief. She feels like it may have gotten a little worse since the injection. It is affecting her ability to walk and work. -Encouraged the use of NSAIDs -Encouraged use of ice and heat -X-ray of hip -X-ray of lumbar spine -Neurontin 100 mg 3 times daily -Strict return precautions given

## 2019-08-21 NOTE — Progress Notes (Signed)
    SUBJECTIVE:   CHIEF COMPLAINT / HPI:   Hip pain  Patient presenting complaining of hip pain which started approximately 2 weeks ago. She was seen in the emergency department on 08/11/2019 where she received an injection into her left trochanteric bursa. Since that time she reports the pain has not improved and may have worsened. She reports the pain increases with movement and decreases with rest. She has not tried NSAIDs or Tylenol. She has tried a warm bath with minimal relief. She reports the pain is in her hip and behind her knee. It radiates down her leg. She says that it affects her ability to work. Denies any warmth in her knee or hip.  OBJECTIVE:   BP 110/80   Pulse 80   Ht 5\' 8"  (1.727 m)   Wt 158 lb (71.7 kg)   SpO2 99%   BMI 24.02 kg/m   General: Well-appearing female, no acute distress Respiratory: Normal work of breathing MSK: Patient with tenderness to palpation of posterior aspect of knee along with lateral aspect of hip. She has pain with flexion and extension of the hip as well as flexion and extension of the knee. No pain with flexion, extension, inversion, eversion of the ankle. No apparent warmth at either joint. No edema noted. Ambulates with difficulty trying to decrease weight on left lower extremity.  ASSESSMENT/PLAN:   Left hip pain Patient reports left hip pain which radiates to the knee and down to the ankle with ambulation over the past 2 weeks. She had an injection in the trochanteric bursa with no relief. She feels like it may have gotten a little worse since the injection. It is affecting her ability to walk and work. -Encouraged the use of NSAIDs -Encouraged use of ice and heat -X-ray of hip -X-ray of lumbar spine -Neurontin 100 mg 3 times daily -Strict return precautions given  Left knee pain Along with the left hip pain patient is also complaining of left knee pain. She is tender to palpation on posterior aspect of knee. Pain with flexion and  extension of the knee no erythema or edema. It is difficult to determine if the pain is radiating from the hip to the knee or if it is from the knee to the hip. -Same recommendations as above regarding NSAIDs, ice, heat as well as Neurontin -Two-view x-ray of knee to assess for anatomical issues     , MD [facility]

## 2019-08-22 ENCOUNTER — Other Ambulatory Visit: Payer: Self-pay

## 2019-08-22 ENCOUNTER — Other Ambulatory Visit: Payer: Self-pay | Admitting: Family Medicine

## 2019-08-22 ENCOUNTER — Telehealth: Payer: Self-pay | Admitting: Family Medicine

## 2019-08-22 ENCOUNTER — Ambulatory Visit
Admission: RE | Admit: 2019-08-22 | Discharge: 2019-08-22 | Disposition: A | Discharge status: Home / Self Care | Payer: BC Managed Care – PPO | Source: Ambulatory Visit | Attending: Family Medicine | Admitting: Family Medicine

## 2019-08-22 DIAGNOSIS — M25562 Pain in left knee: Secondary | ICD-10-CM

## 2019-08-22 DIAGNOSIS — M25552 Pain in left hip: Secondary | ICD-10-CM

## 2019-08-22 IMAGING — CR DG KNEE 1-2V*L*
2 series · 2 of 2 positions shown · non-contrast
Comparison: None

CLINICAL DATA: Chronic LEFT-sided low back pain radiating to LEFT
leg, LEFT hip and LEFT knee pain, no known injury

EXAM:
LEFT KNEE - 1-2 VIEW

[w knee ap left]
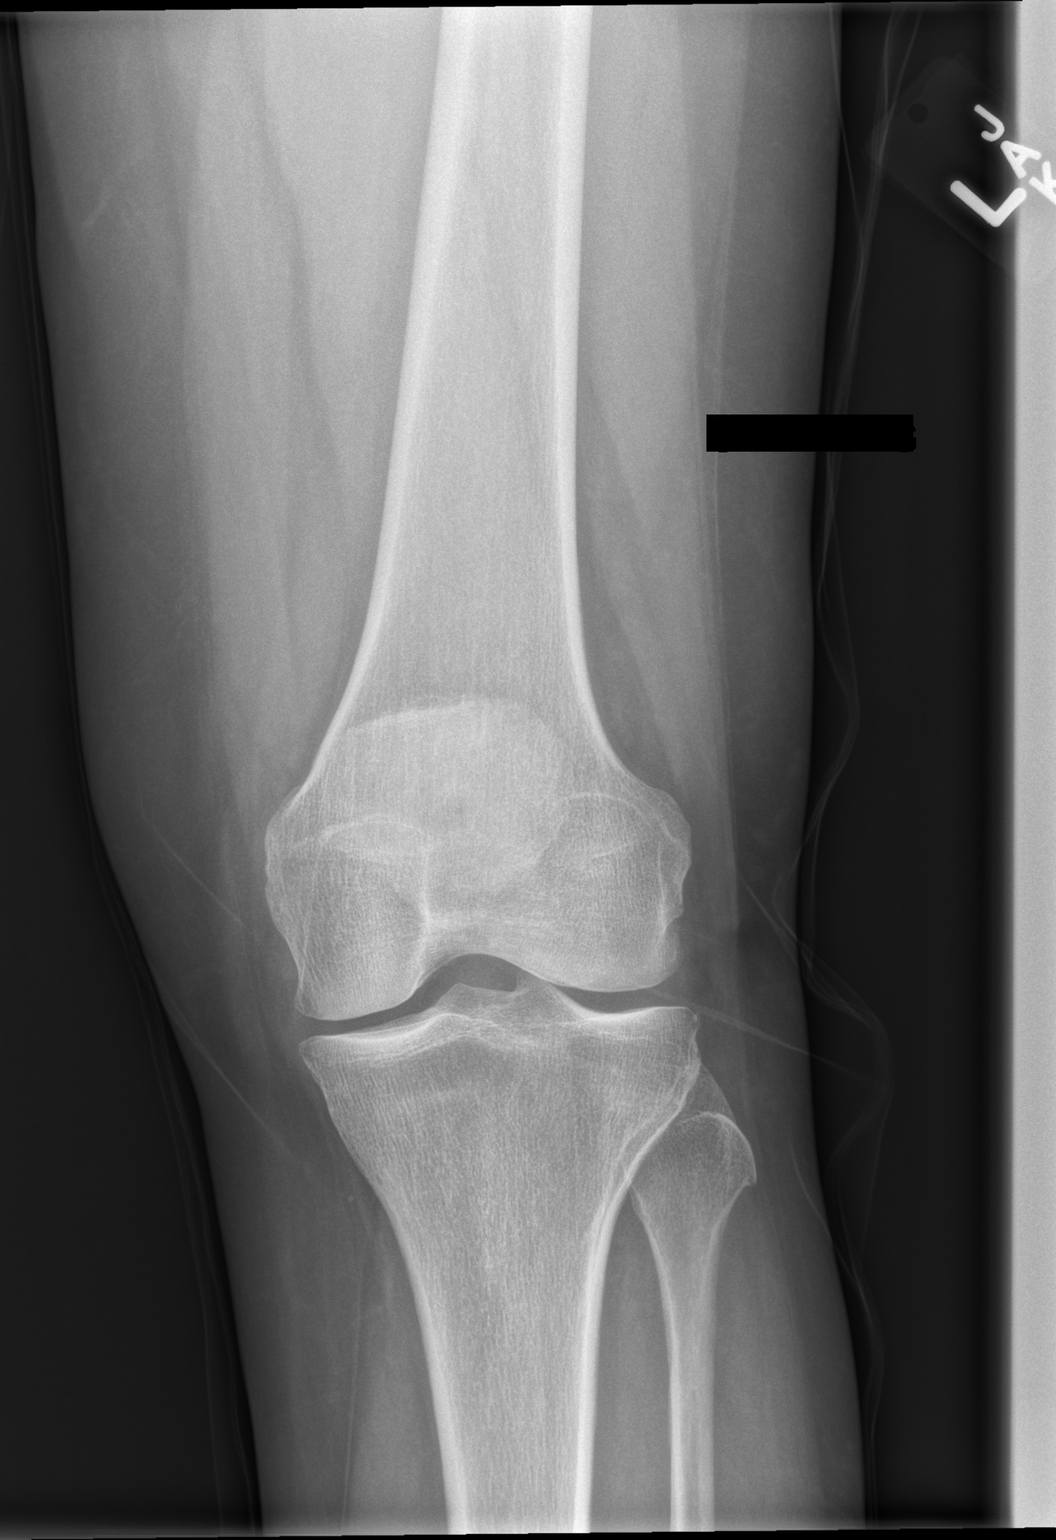

[w knee lat left]
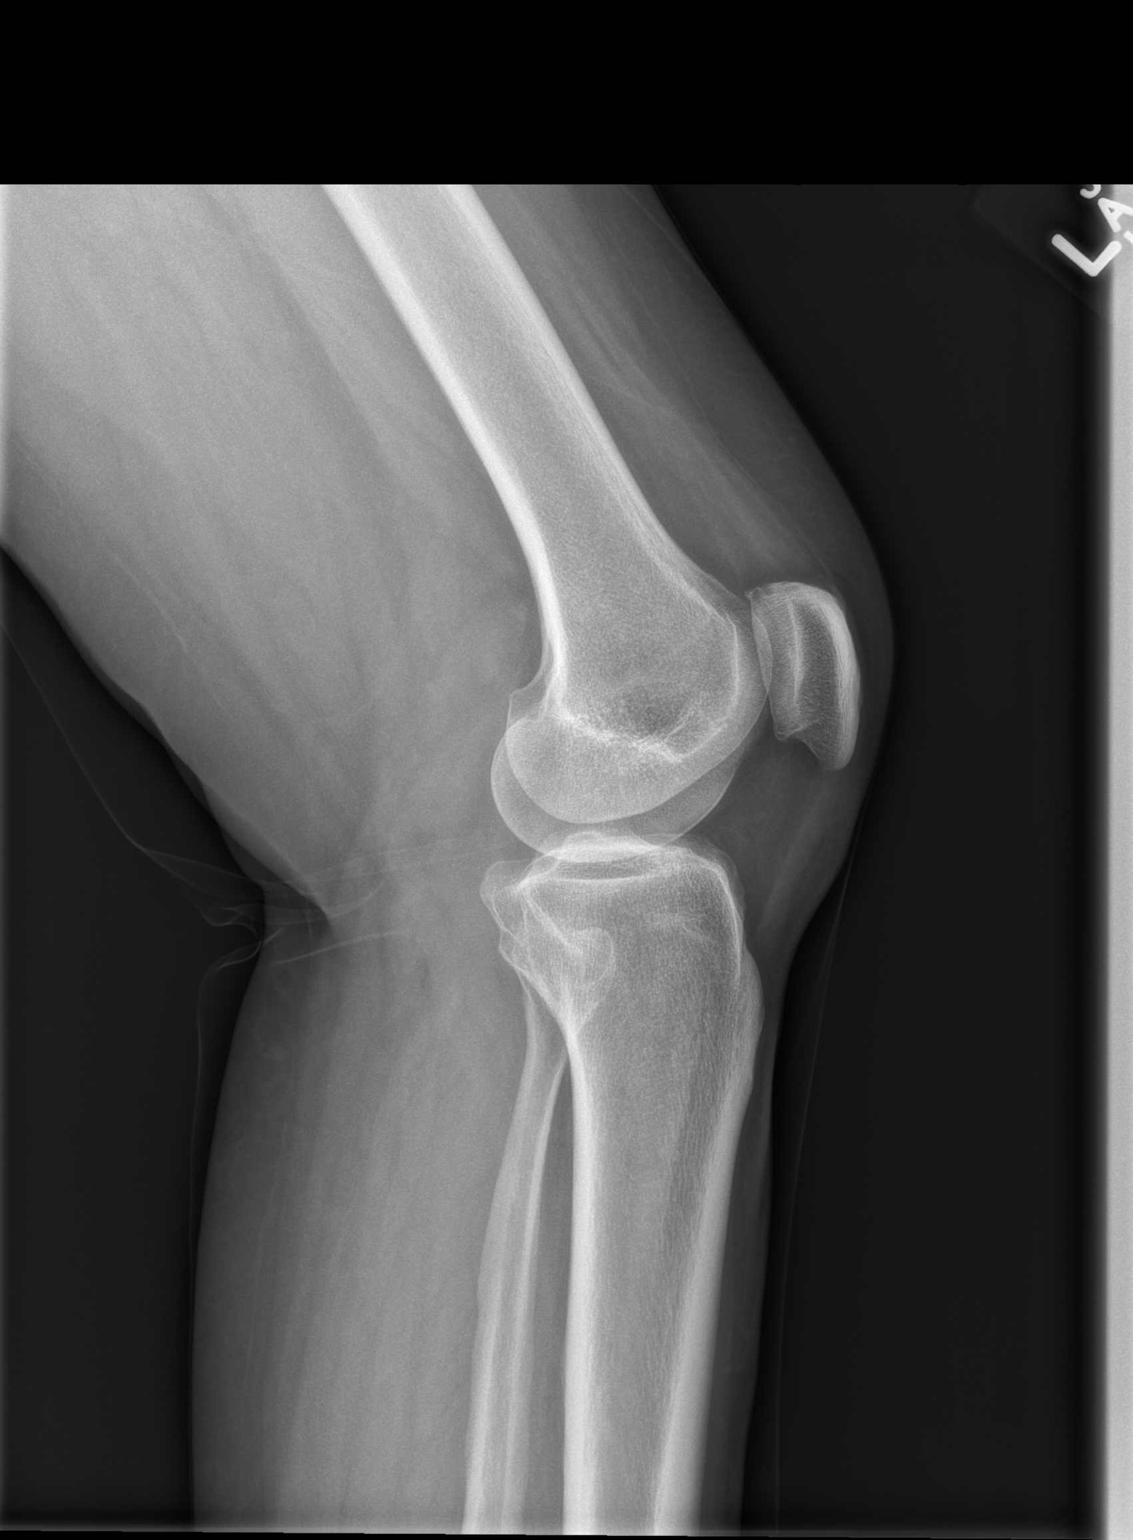

[2 of 2 positions shown; findings below may reference images not displayed]

FINDINGS: Osseous mineralization normal.

Medial compartment joint space narrowing.

No acute fracture, dislocation, or bone destruction.

No joint effusion.
IMPRESSION: Mild degenerative changes at medial compartment LEFT knee.

## 2019-08-22 IMAGING — CR DG LUMBAR SPINE 2-3V
3 series · 3 of 3 positions shown · non-contrast
Comparison: None

CLINICAL DATA: Chronic LEFT-sided low back pain radiating to LEFT
leg; LEFT hip and LEFT knee pain, no known injury

EXAM:
LUMBAR SPINE - 2-3 VIEW

[w lumbar spine ap]
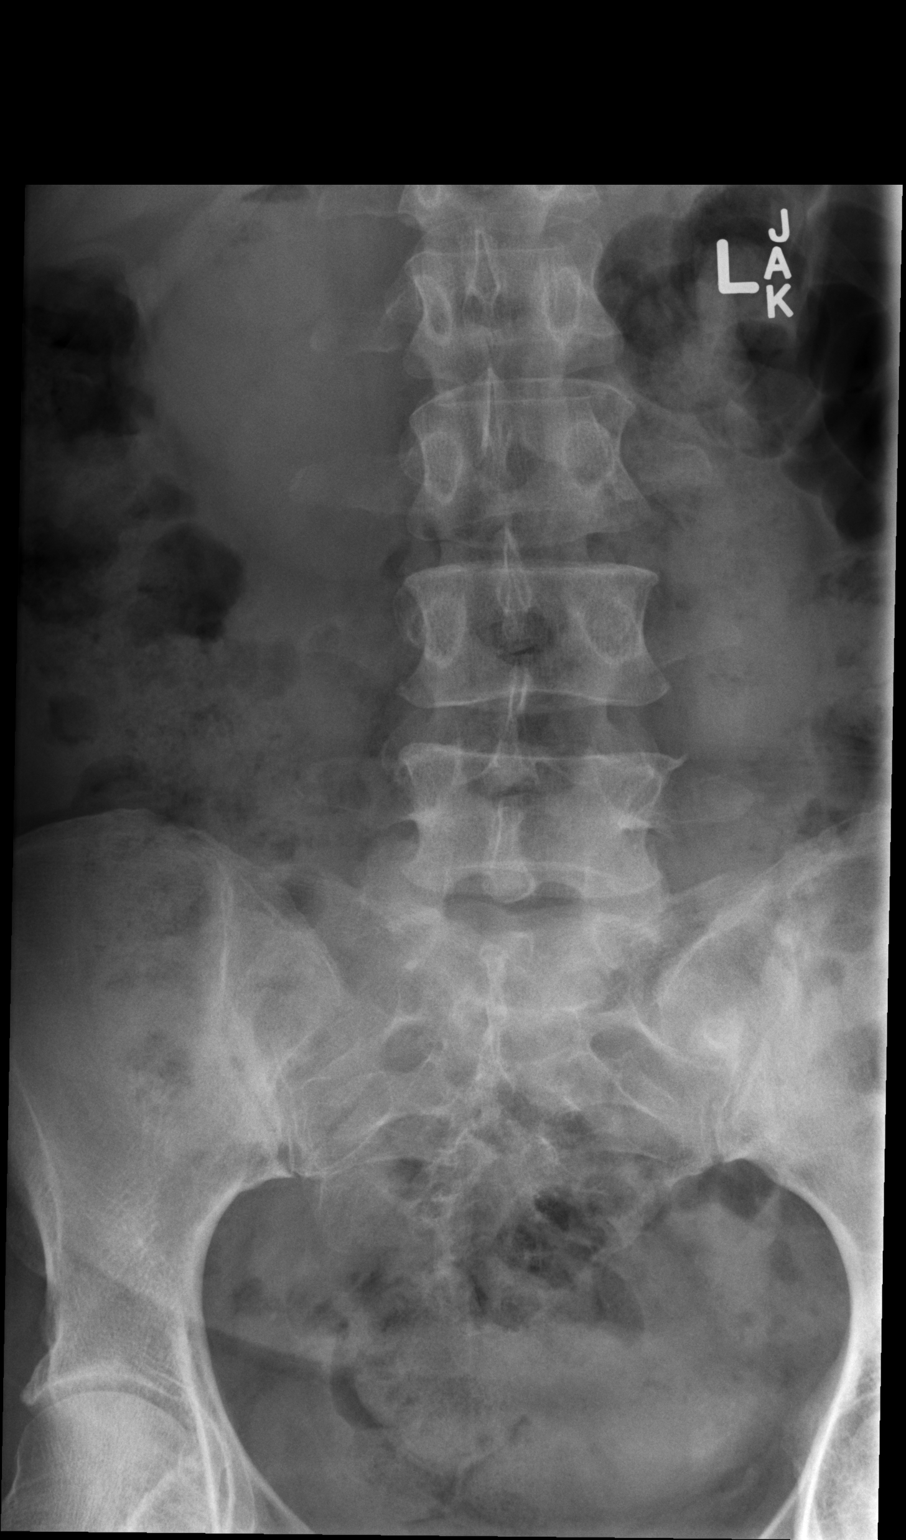

[w lumbar spine lat]
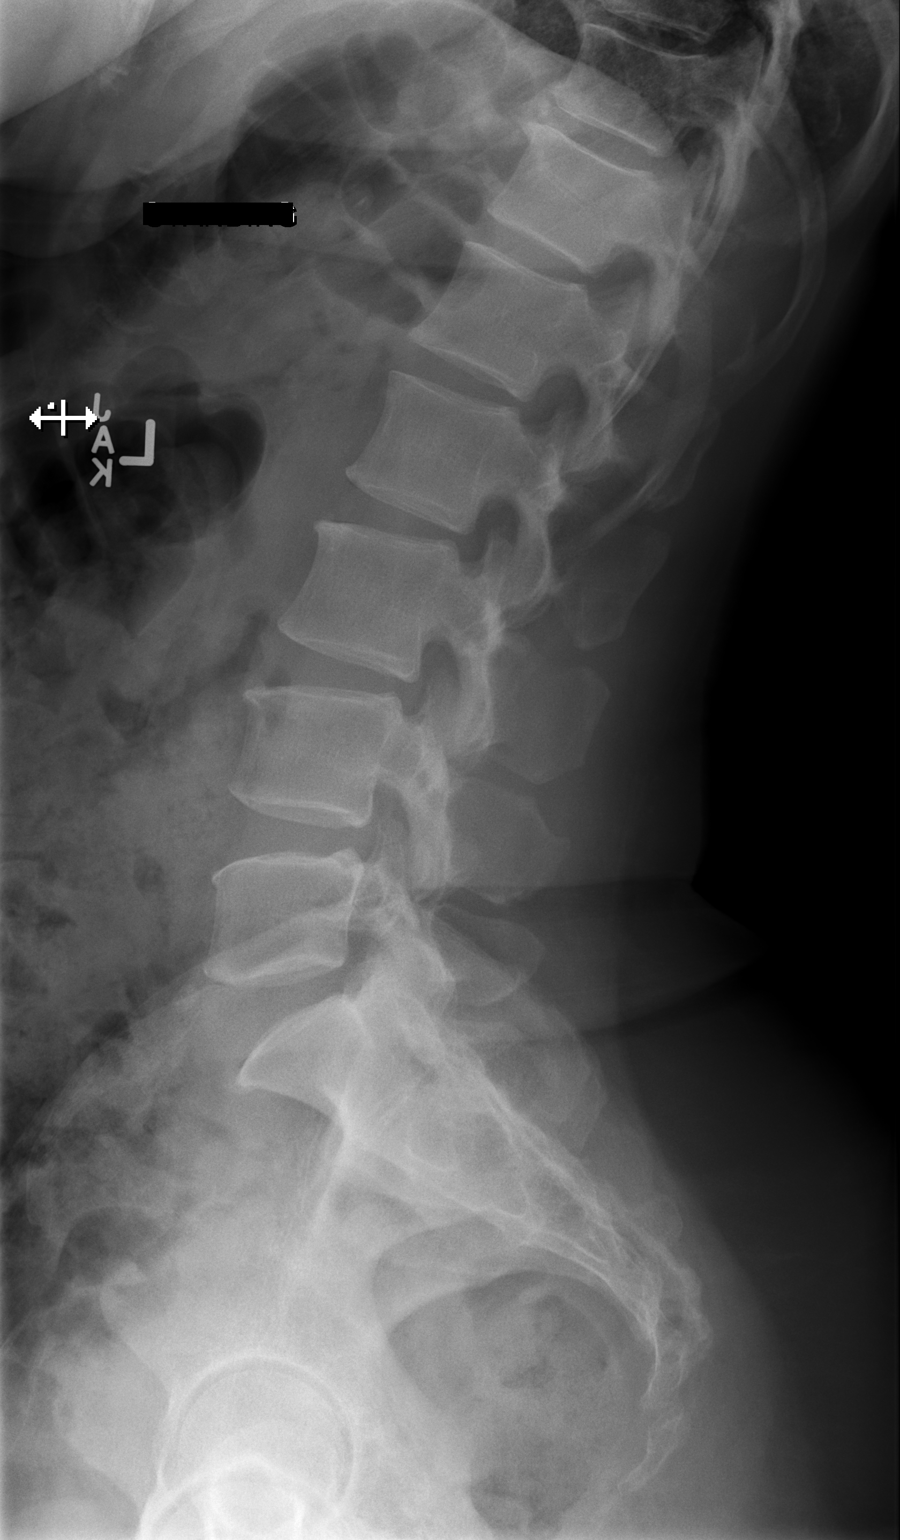

[w lumbar l-5 s-1 spot]
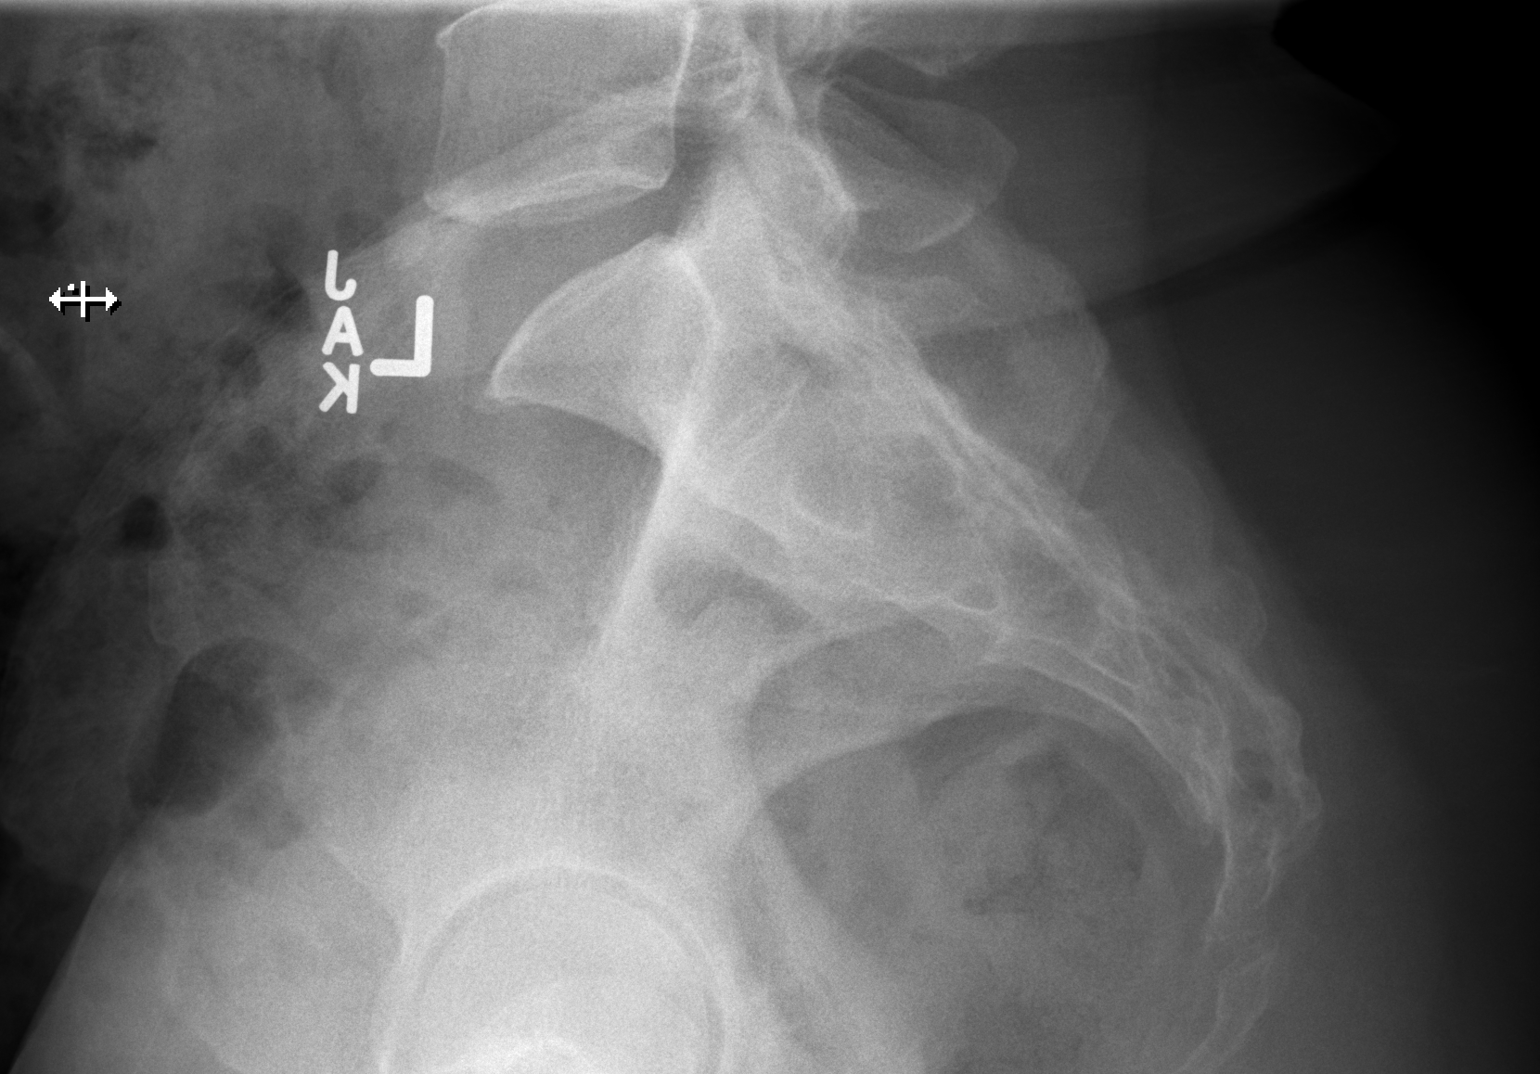

[3 of 3 positions shown; findings below may reference images not displayed]

FINDINGS: 5 non-rib-bearing lumbar vertebra.

Low normal osseous mineralization.

Mild broad-based levoconvex lumbar scoliosis.

Vertebral body heights maintained.

Disc space narrowing and endplate spur formation at T11-T12.

No fracture, subluxation, or bone destruction.

No spondylolysis.

RIGHT SI joint appears slightly narrowed in less well-defined versus
LEFT, cannot exclude asymmetric sacroiliitis.
IMPRESSION: Cannot exclude asymmetric sacroiliitis at RIGHT SI joint.

No acute lumbar spine abnormalities.

## 2019-08-22 IMAGING — CR DG HIP (WITH OR WITHOUT PELVIS) 2-3V*L*
2 series · 2 of 2 positions shown · non-contrast
Comparison: None

CLINICAL DATA: Chronic LEFT-sided low back pain radiating to LEFT
leg, LEFT hip and LEFT knee pain, no known injury

EXAM:
DG HIP (WITH OR WITHOUT PELVIS) 2-3V LEFT

[w pelvis upright]
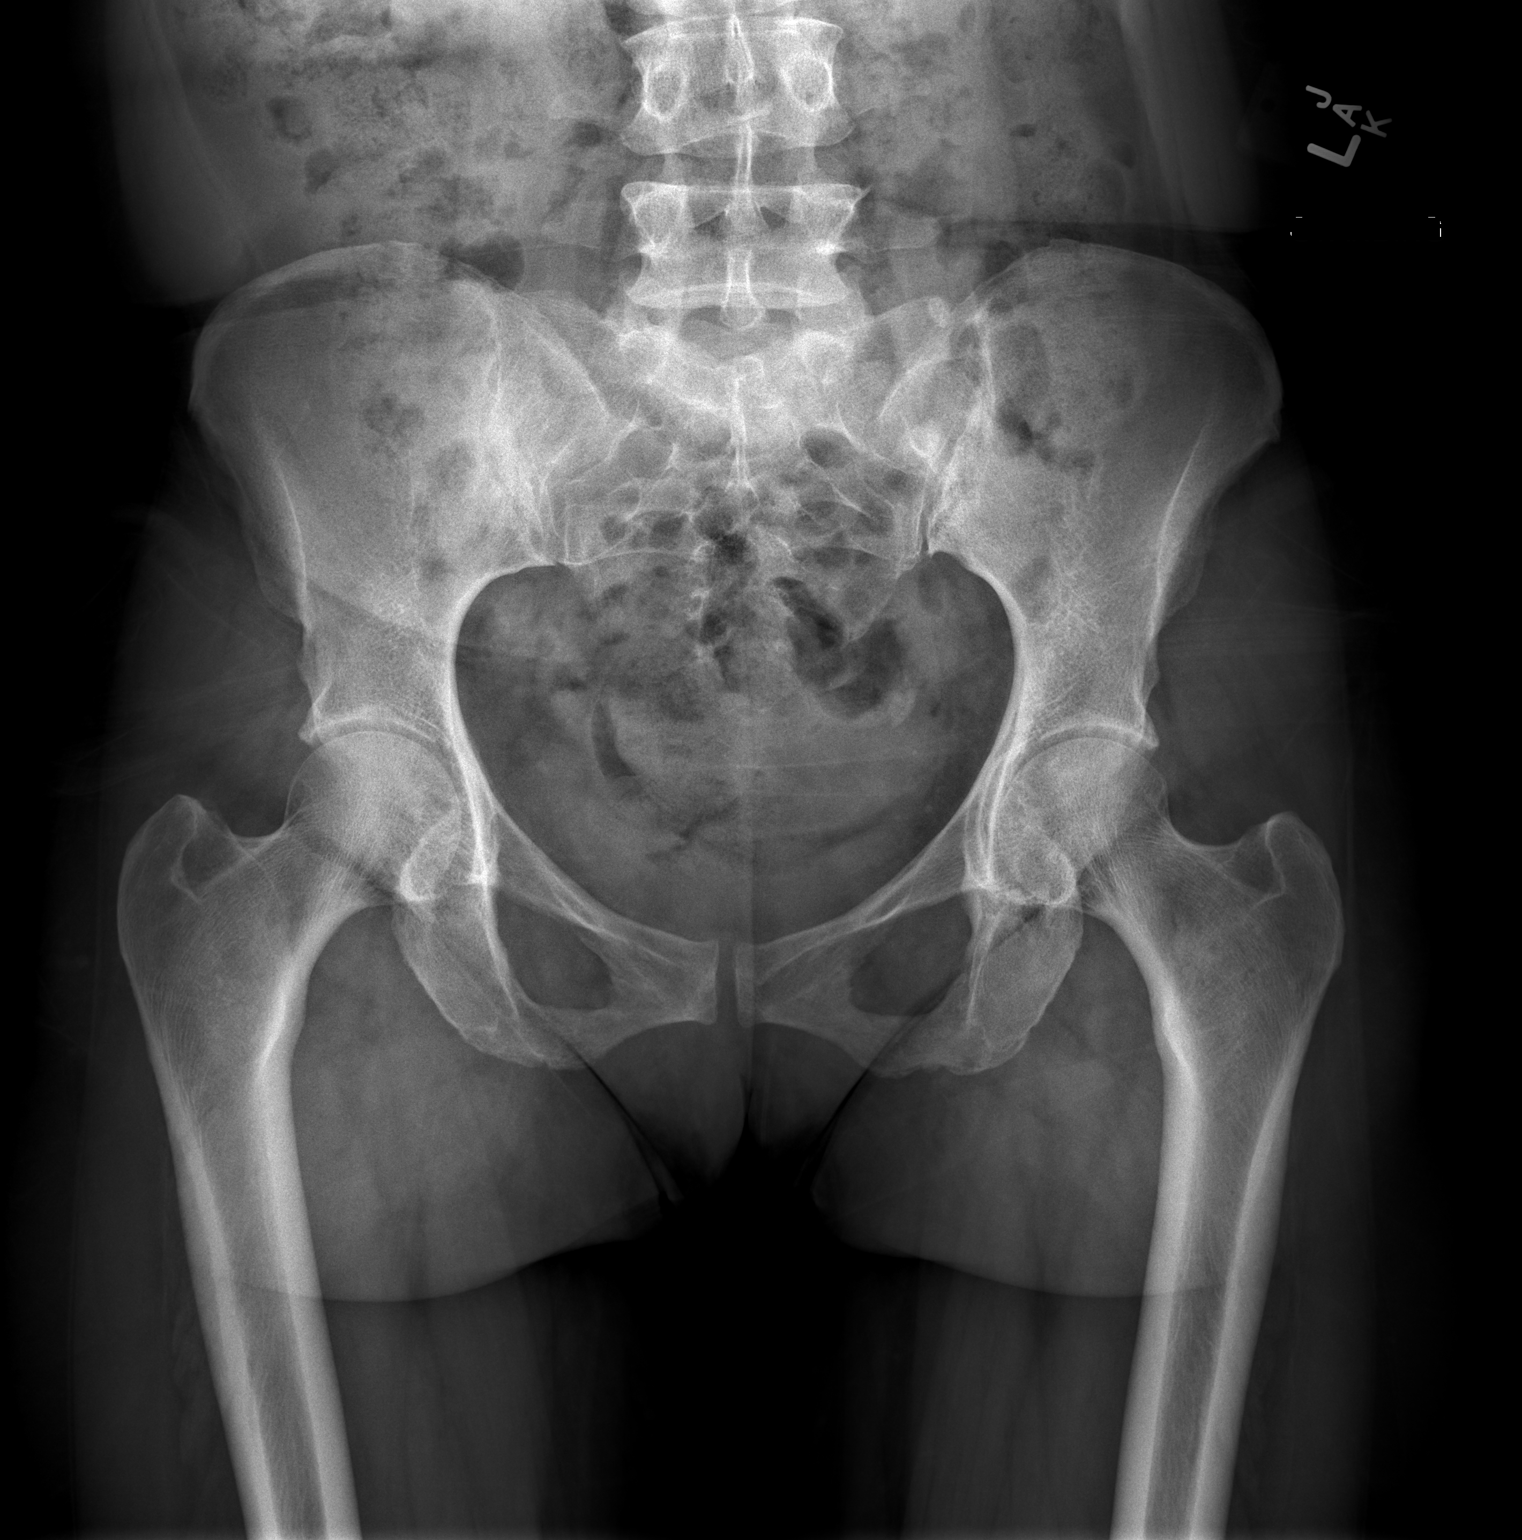

[w hip ap left]
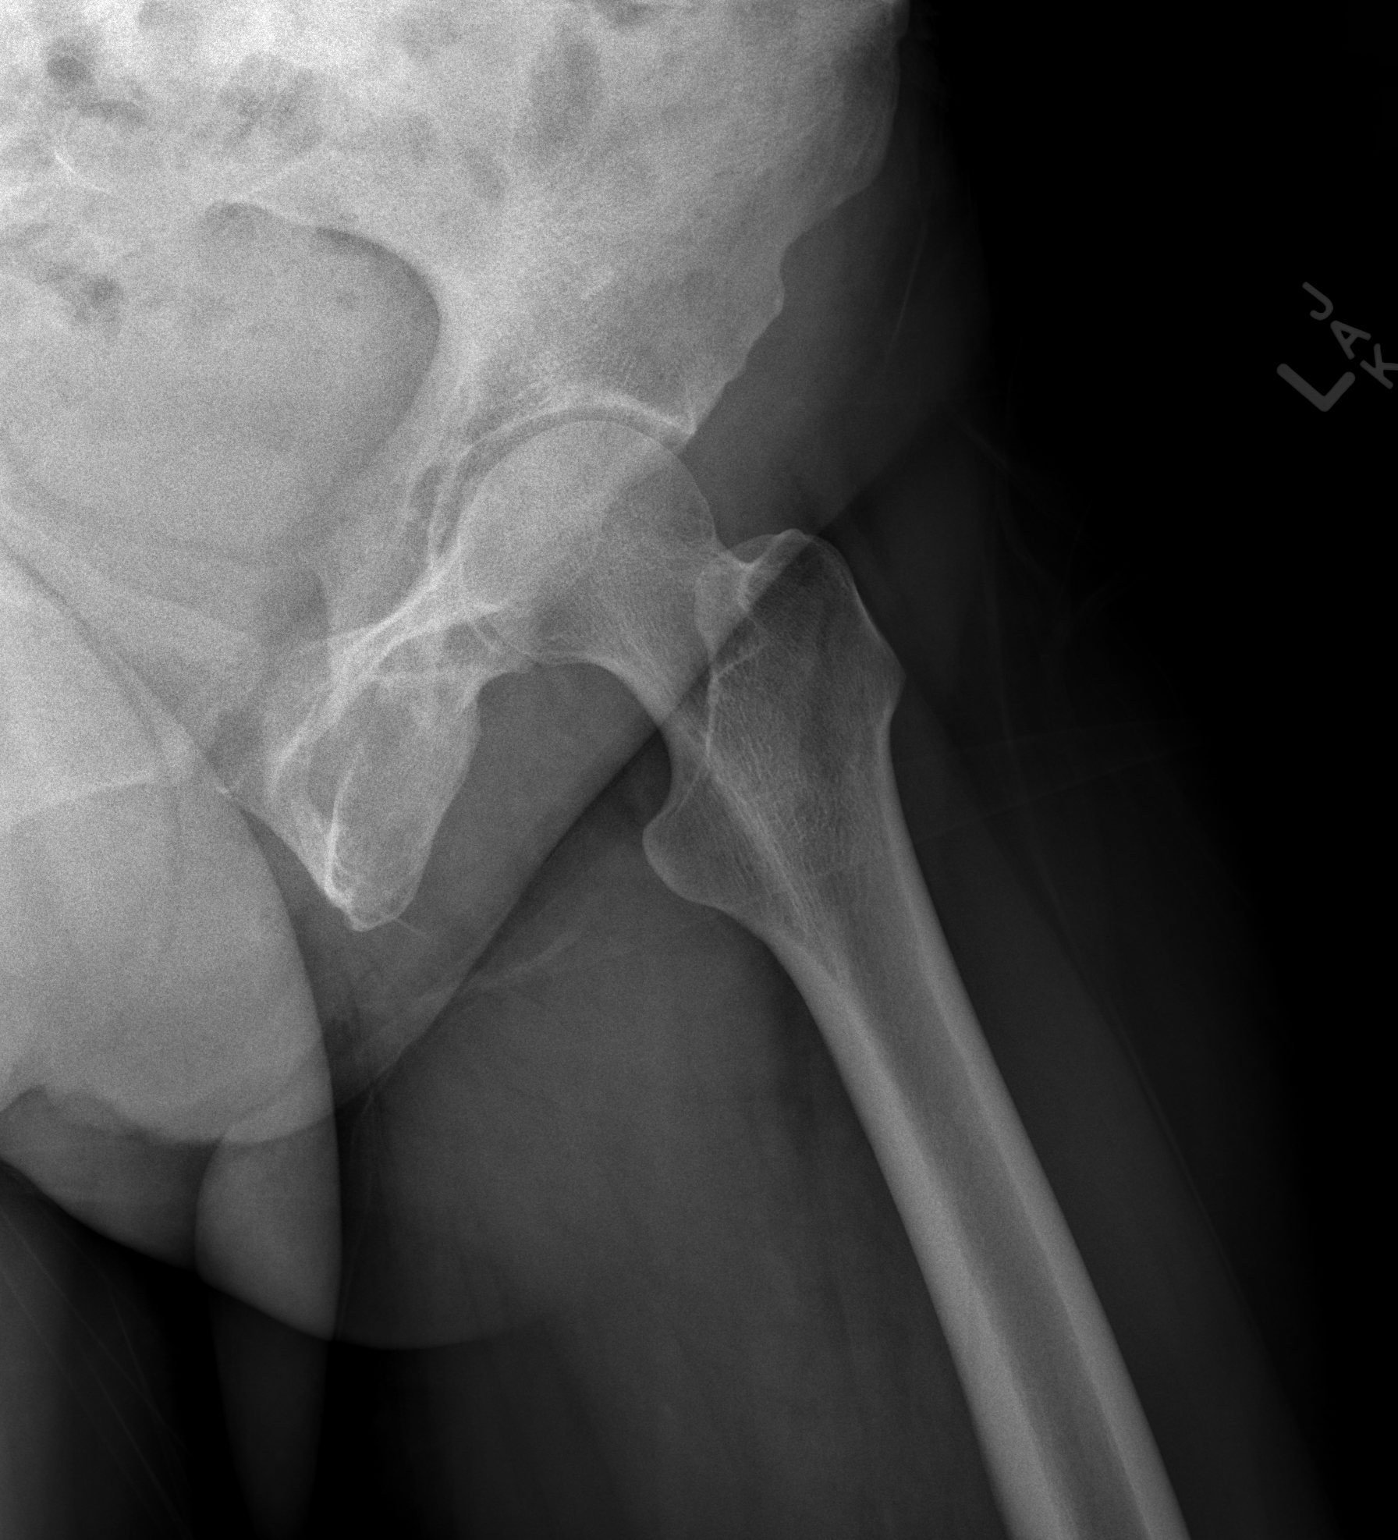

[2 of 2 positions shown; findings below may reference images not displayed]

FINDINGS: Osseous mineralization probably normal for technique.

Minimal narrowing of the hip joints bilaterally.

LEFT SI joint preserved with RIGHT SI joint less well profiled.

No acute fracture, dislocation, or bone destruction.
IMPRESSION: Minimal degenerative changes of the hip joints bilaterally.

No acute abnormalities.

## 2019-08-22 NOTE — Telephone Encounter (Signed)
Patients daughter called saying her mother needs the note written for work, to state the 1st through the 8th. Thanks

## 2019-08-22 NOTE — Telephone Encounter (Signed)
Spoke with MD, ok to write out through June 8th. Will generate letter. Attempted to contact daughter, however no answer or option for VM. Work note is up front.

## 2019-08-26 ENCOUNTER — Telehealth: Payer: Self-pay | Admitting: Family Medicine

## 2019-08-27 NOTE — Telephone Encounter (Signed)
I spoke with this patient's daughter regarding patient's hip pain.  She is still having issues with it and has not seen any improvement.  Discussed the results of the x-rays with the patient's.  She asked if she could be seen for another visit and has provided FMLA paperwork to our clinic.  I have no clinics available until July but my attending Dr. Manson Passey agreed to see her in [place] [facility] on 6/15.  There was a clinic spot available for the 2 PM spot.  I scheduled this appointment.

## 2019-09-02 ENCOUNTER — Ambulatory Visit (INDEPENDENT_AMBULATORY_CARE_PROVIDER_SITE_OTHER): Payer: BC Managed Care – PPO | Admitting: Family Medicine

## 2019-09-02 ENCOUNTER — Other Ambulatory Visit: Payer: Self-pay

## 2019-09-02 VITALS — BP 104/76 | HR 82 | Ht 68.0 in | Wt 159.0 lb

## 2019-09-02 DIAGNOSIS — R768 Other specified abnormal immunological findings in serum: Secondary | ICD-10-CM

## 2019-09-02 DIAGNOSIS — M25552 Pain in left hip: Secondary | ICD-10-CM

## 2019-09-02 MED ORDER — NAPROXEN 500 MG PO TABS: 500.0000 mg | ORAL_TABLET | Freq: Two times a day (BID) | ORAL | 0 refills | Status: DC

## 2019-09-02 MED ORDER — POLYETHYLENE GLYCOL 3350 17 GM/SCOOP PO POWD: 17.0000 g | Freq: Three times a day (TID) | ORAL | 1 refills | Status: DC | PRN

## 2019-09-02 NOTE — Progress Notes (Signed)
SUBJECTIVE:   CHIEF COMPLAINT / HPI:   The patient speaks Arabic as their primary language.  An interpreter was used for the entire visit.   Carrie Rivas is a pleasant 21 year with history of obesity, glaucoma, and left leg pain thought to be due to trochanteric bursitis presenting today for persistent left hip pain.   Patient is joined by her daughter today.  A live interpreter is also present.  The patient currently works at [employer].  She started work there several years ago and around the end of May had the sudden onset of left hip pain.  This started without injury.  The left hip pain is located laterally along her left hip and radiates down her left leg to her foot.  She describes associated numbness and weakness at times.  She has not had any falls.  She is ambulating.  She typically wears flip-flops.  She was seen in the emergency department for this on 24 May and received a trochanteric bursa injection which improved her symptoms for a few days.  Her symptoms returned.  And have been unrelenting since that time.  She describes her pain is worse when sitting or standing.  It is only alleviated with lifting of her leg while she is laying down.  She denies change in bowel or bladder symptoms.  She denies fever.  She intermittently has back pain but none today.  No prior surgeries no history of trauma.  The patient also reports constipation.  She reports this started when she started taking gabapentin and the other medications prescribed for her hip pain.  She denies melena or hematochezia.  She denies abdominal pain.  She reports he typically does have constipation but she has not had a bowel movement in several days.   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 Normal S1/S2. No murmurs, rubs, or gallops appreciated. Lungs: Clear bilaterally to ascultation.  Abdomen: Normoactive bowel sounds. No tenderness to deep or light palpation. No rebound or guarding.  MSK   Antalgic gait Favors right leg over left.  Spinal exam is normal.  There is no rash.  No tenderness to palpation along bilateral SI joints or vertebral processes.  Right hip exam unremarkable.  Left hip exam notable for tenderness to palpation over the lateral aspect of the left hip.  She has pain with internal and external range of motion in the hip itself  Knee strain.  Preserved flexion and extension of the hip.  She has reduced strength with hip flexion that is 4+ out of 5.  Otherwise her strength is preserved including with her dorsiflexion, and plantar flexion.  2+ patellar reflexes downgoing toes 1+ Achilles reflexes.  Preserved sensation.  She has a negative straight leg raise and a positive FADIR on the left. Negative contralateral straight leg raise.  ASSESSMENT/PLAN:   Lateral Hip Pain most consistent with trochanteric bursitis.  She has not completed any therapy or exercises for this.  Her pain is more severe than I would expect given the underlying suspected diagnosis.  Differential also considered included occult fracture although she has had a previous x-ray, OA also considered but not noted significantly on previous  x-ray. Other causes include referred pain from the degenerative disc disease and nerve root impingement.  Her symptoms are not consistent with sciatica.  Trochanteric bursitis is most likely and her symptoms did improve with steroid injection.  She has considerable pain on exam with light palpation.  She has not tried NSAIDs or Tylenol.  Recommended starting with these.  Discontinue gabapentin as she has not found this beneficial.  Discussed at length with patient, given its severity of symptoms will refer to orthopedics for further recommendations and management.   Query of MRI is warranted in this setting.  Referral to physical therapy placed.  Work note was given to the patient through 6/29.  Discussed that I think activity important in helping to heal this.  Highly recommended and encouraged her to attend. Also consider shingles- no rash. No bowel/bladder symptoms suggestive of epidural process.   Constipation, acute on chronic, suspect idiopathic.  Recommend colonoscopy in future.  MiraLAX prescribed.  Reviewed return precautions.  History of positive hepatitis B core antibody noted on health department labs will repeat today along with appropriate serologies and follow-up.  We will also check liver function given this positive hepatitis B core antibody.  Terisa Starr, MD  Family Medicine Teaching Service  [facility]

## 2019-09-02 NOTE — Patient Instructions (Addendum)
It was wonderful to see you today.  Please bring ALL of your medications with you to every visit.   Today we talked about:  1. Your constipation- Miralax three times per day   2. Tylenol- 1,000 mg three times per day  3. You should be called by Orthopedics and Physical Therapy   4. Have your work fax your FMLA paperwork   5. Take naproxen with plenty of food and water  I will call you with blood work results   Thank you for choosing [facility].   Please call [phone] with any questions about today's appointment.  Please be sure to schedule follow up at the front  desk before you leave today.   Terisa Starr, MD  Family Medicine

## 2019-09-03 ENCOUNTER — Encounter: Payer: Self-pay | Admitting: Family Medicine

## 2019-09-03 DIAGNOSIS — Z8619 Personal history of other infectious and parasitic diseases: Secondary | ICD-10-CM | POA: Insufficient documentation

## 2019-09-03 LAB — CBC WITH DIFFERENTIAL/PLATELET
Basophils Absolute: 0 10*3/uL (ref 0.0–0.2)
Basos: 1 %
EOS (ABSOLUTE): 0.1 10*3/uL (ref 0.0–0.4)
Eos: 3 %
Hematocrit: 41.9 % (ref 34.0–46.6)
Hemoglobin: 14.1 g/dL (ref 11.1–15.9)
Immature Grans (Abs): 0 10*3/uL (ref 0.0–0.1)
Immature Granulocytes: 0 %
Lymphocytes Absolute: 1.5 10*3/uL (ref 0.7–3.1)
Lymphs: 45 %
MCH: 29.7 pg (ref 26.6–33.0)
MCHC: 33.7 g/dL (ref 31.5–35.7)
MCV: 88 fL (ref 79–97)
Monocytes Absolute: 0.3 10*3/uL (ref 0.1–0.9)
Monocytes: 8 %
Neutrophils Absolute: 1.4 10*3/uL (ref 1.4–7.0)
Neutrophils: 43 %
Platelets: 240 10*3/uL (ref 150–450)
RBC: 4.75 x10E6/uL (ref 3.77–5.28)
RDW: 12.5 % (ref 11.7–15.4)
WBC: 3.3 10*3/uL — ABNORMAL LOW (ref 3.4–10.8)

## 2019-09-03 LAB — COMPREHENSIVE METABOLIC PANEL
ALT: 14 IU/L (ref 0–32)
AST: 15 IU/L (ref 0–40)
Albumin/Globulin Ratio: 1.4 (ref 1.2–2.2)
Albumin: 4.2 g/dL (ref 3.8–4.9)
Alkaline Phosphatase: 76 IU/L (ref 48–121)
BUN/Creatinine Ratio: 20 (ref 9–23)
BUN: 13 mg/dL (ref 6–24)
Bilirubin Total: 0.3 mg/dL (ref 0.0–1.2)
CO2: 25 mmol/L (ref 20–29)
Calcium: 9.5 mg/dL (ref 8.7–10.2)
Chloride: 105 mmol/L (ref 96–106)
Creatinine, Ser: 0.66 mg/dL (ref 0.57–1.00)
GFR calc Af Amer: 117 mL/min/{1.73_m2} (ref >59)
GFR calc non Af Amer: 102 mL/min/{1.73_m2} (ref >59)
Globulin, Total: 3 g/dL (ref 1.5–4.5)
Glucose: 94 mg/dL (ref 65–99)
Potassium: 4.3 mmol/L (ref 3.5–5.2)
Sodium: 141 mmol/L (ref 134–144)
Total Protein: 7.2 g/dL (ref 6.0–8.5)

## 2019-09-03 LAB — HEPATITIS B SURFACE ANTIBODY, QUANTITATIVE: Hepatitis B Surf Ab Quant: 92.8 m[IU]/mL (ref >9.9)

## 2019-09-03 LAB — HIV ANTIBODY (ROUTINE TESTING W REFLEX): HIV Screen 4th Generation wRfx: NONREACTIVE

## 2019-09-03 LAB — HEPATITIS B CORE ANTIBODY, TOTAL: Hep B Core Total Ab: POSITIVE — AB

## 2019-09-03 LAB — RPR: RPR Ser Ql: NONREACTIVE

## 2019-09-03 LAB — HCV INTERPRETATION

## 2019-09-03 LAB — HEPATITIS B SURFACE ANTIGEN: Hepatitis B Surface Ag: NEGATIVE

## 2019-09-03 LAB — HCV AB W REFLEX TO QUANT PCR: HCV Ab: <0.1 s/co ratio (ref 0.0–0.9)

## 2019-09-07 NOTE — Telephone Encounter (Signed)
FMLA paperwork received.  Reviewed--patient did not sign release of records.  Completed FMLA and [employer] paperwork.   Nursing- please call patient (daughter is best contact) and have her sign two consents. The completed forms are in wall pocket box with the consents. There appears to be 1 missing page of FMLA paperwork (page 3).  Dr. Nobie Putnam you will need to complete the [employer] paperwork from 6/8. This is in your box. Please let me know you if you have questions as you complete this  Terisa Starr, MD  [facility] Teaching Service

## 2019-09-08 ENCOUNTER — Ambulatory Visit (INDEPENDENT_AMBULATORY_CARE_PROVIDER_SITE_OTHER): Payer: BC Managed Care – PPO | Admitting: Physician Assistant

## 2019-09-08 ENCOUNTER — Telehealth: Payer: Self-pay | Admitting: *Deleted

## 2019-09-08 ENCOUNTER — Other Ambulatory Visit: Payer: Self-pay

## 2019-09-08 ENCOUNTER — Encounter: Payer: Self-pay | Admitting: Physician Assistant

## 2019-09-08 DIAGNOSIS — M5416 Radiculopathy, lumbar region: Secondary | ICD-10-CM | POA: Diagnosis not present

## 2019-09-08 MED ORDER — METHYLPREDNISOLONE 4 MG PO TABS
ORAL_TABLET | ORAL | 0 refills | Status: DC
Start: 2019-09-08 — End: 2019-11-18

## 2019-09-08 MED ORDER — CYCLOBENZAPRINE HCL 10 MG PO TABS
10.0000 mg | ORAL_TABLET | Freq: Three times a day (TID) | ORAL | 1 refills | Status: DC | PRN
Start: 2019-09-08 — End: 2020-01-21

## 2019-09-08 NOTE — Progress Notes (Signed)
Office Visit Note   Patient: Carrie Rivas           Date of Birth: 09-Jul-1965           MRN: [id number] Visit Date: 09/08/2019              Requested by: Martyn Malay, MD [address] [place],  [place] [address] PCP: Gifford Shave, MD   Assessment & Plan: Visit Diagnoses:  1. Lumbar radicular pain     Plan: We will send her to physical therapy for home exercise program, core strengthening, back exercises, IT band stretching and modalities.  She will stop her naproxen while on Medrol dose pack for 6 days and then can resume the naproxen.  Also discussed no other NSAIDs while on the Medrol Dosepak.  Flexeril was given mostly use at night she can use this during the day but is not to operate any heavy machinery or automobile.  We will see her back in 1 month to see what type of results she had with his conservative treatment.  Questions were encouraged and answered at length.  Interpreter was present throughout the examination today due to the fact the patient speaks Arabic /Sudanese dialect.  Follow-Up Instructions: Return in about 4 weeks (around 10/06/2019).   Orders:  No orders of the defined types were placed in this encounter.  Meds ordered this encounter  Medications  . methylPREDNISolone (MEDROL) 4 MG tablet    Sig: Take as directed    Dispense:  21 tablet    Refill:  0  . cyclobenzaprine (FLEXERIL) 10 MG tablet    Sig: Take 1 tablet (10 mg total) by mouth 3 (three) times daily as needed for muscle spasms.    Dispense:  40 tablet    Refill:  1      Procedures: No procedures performed   Clinical Data: No additional findings.   Subjective: Chief Complaint  Patient presents with  . Left Hip - Pain    HPI Patient is a 54 year old female were seen for the first time for left hip pain.  No known injury.  Pain lateral aspect of her hip radiates down to her left ankle.  She initially had some numbness but now is just having pain.  She had a trochanteric  injection with her primary care physician which helps some.  Pain does awaken her.  She has acute on chronic constipation which is being treated by her primary care physician.  No saddle anesthesia like symptoms.  She is taken naproxen which has helped some.  Also done some stretching exercises at home and applied heat to her low back.  She had no known injury. Radiographs of her lumbar spine dated 08/22/2019 are reviewed by myself personally shows no acute fractures no spondylolisthesis.  Disc space overall well-maintained.  Normal lordotic curvature. AP pelvis and lateral view of the left hip dated 08/22/2019 are reviewed and show no acute fracture.  Both hips well located.  No bony abnormalities.  Review of Systems See HPI otherwise negative or noncontributory.  Objective: Vital Signs: There were no vitals taken for this visit.  Physical Exam Constitutional:      Appearance: She is normal weight.  Cardiovascular:     Pulses: Normal pulses.  Pulmonary:     Effort: Pulmonary effort is normal.  Neurological:     Mental Status: She is alert.  Psychiatric:        Behavior: Behavior normal.     Ortho Exam Lower  extremities 5 out of 5 strength throughout against resistance.  Deep tendon reflexes are 2+ at the knees and ankles and equal and symmetric.  Positive straight leg raise on the left negative on the right.  Full forward flexion lumbar spine limited extension.  Tenderness left lower paraspinous region.  Good range of motion both hips without pain.  Slight tenderness over the left trochanteric region.  Specialty Comments:  No specialty comments available.  Imaging: No results found.   PMFS History: Patient Active Problem List   Diagnosis Date Noted  . History of hepatitis B virus infection 09/03/2019  . Left hip pain 08/21/2019  . Left knee pain 08/21/2019  . Pallor of nail bed 12/23/2018  . Seasonal allergies 12/23/2018  . Healthcare maintenance 12/23/2018  . Menopause  12/23/2018  . 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 Glaucoma 11/26/2015  . 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 Right flank pain, chronic 11/26/2015   Past Medical History:  Diagnosis Date  . Glaucoma   . Right flank pain   . Vision loss of right eye     History reviewed. No pertinent family history.  Past Surgical History:  Procedure Laterality Date  . GLAUCOMA SURGERY  In 11/2014   with subsequent loss of vision in right eye   Social History   Occupational History  . Occupation: Industrial    Comment: [employer]   Tobacco Use  . Smoking status: Never Smoker  . Smokeless tobacco: Never Used  Substance and Sexual Activity  . Alcohol use: No  . Drug use: No  . Sexual activity: Not Currently

## 2019-09-08 NOTE — Telephone Encounter (Signed)
Received form from Dr. Manson Passey.  It is FMLA paperwork from ts job.    Called and spoke with daughter and let her know that her mom would need to come sign the authorization form before we could fax it.  Left form at front desk.  Daughter will bring patient by today. Jone Baseman, CMA

## 2019-09-08 NOTE — Telephone Encounter (Signed)
SEE THE BELOW FROM NEW PHONE NOTE STARTED 09/08/19 BY ACCIDENT. Jone Baseman, CMA     09/08/19 10:52 AM Note Patient came by and filled out authorization.  Will be faxed by Annice Pih and copy placed in batch scanning.   Jone Baseman, CMA       09/08/19 9:34 AM Note Received form from Dr. Manson Passey.  It is FMLA paperwork from ts job.    Called and spoke with daughter and let her know that her mom would need to come sign the authorization form before we could fax it.  Left form at front desk.  Daughter will bring patient by today. Jone Baseman, CMA

## 2019-09-08 NOTE — Telephone Encounter (Signed)
Patient came by and filled out authorization.  Will be faxed by Annice Pih and copy placed in batch scanning.   Jone Baseman, CMA

## 2019-09-08 NOTE — Addendum Note (Signed)
Addended by: Mardene Celeste B on: 09/08/2019 01:18 PM   Modules accepted: Orders

## 2019-09-12 ENCOUNTER — Ambulatory Visit: Payer: BC Managed Care – PPO | Attending: Physician Assistant | Admitting: Physical Therapy

## 2019-09-12 ENCOUNTER — Other Ambulatory Visit: Payer: Self-pay

## 2019-09-12 ENCOUNTER — Encounter: Payer: Self-pay | Admitting: Physical Therapy

## 2019-09-12 DIAGNOSIS — M6281 Muscle weakness (generalized): Secondary | ICD-10-CM | POA: Insufficient documentation

## 2019-09-12 DIAGNOSIS — M5442 Lumbago with sciatica, left side: Secondary | ICD-10-CM | POA: Insufficient documentation

## 2019-09-12 DIAGNOSIS — M5416 Radiculopathy, lumbar region: Secondary | ICD-10-CM | POA: Insufficient documentation

## 2019-09-12 DIAGNOSIS — R209 Unspecified disturbances of skin sensation: Secondary | ICD-10-CM | POA: Diagnosis present

## 2019-09-12 DIAGNOSIS — R208 Other disturbances of skin sensation: Secondary | ICD-10-CM

## 2019-09-12 NOTE — Therapy (Signed)
[place], [place], [address] Phone: [phone]   Fax:  [phone]  Physical Therapy Evaluation  Patient Details  Name: Carrie Rivas MRN: [id number] Date of Birth: 1965-04-11 Referring Provider (PT): Erskine Emery, [place]    Encounter Date: 09/12/2019   PT End of Session - 09/12/19 1336    Visit Number 1    Number of Visits 12    Date for PT Re-Evaluation 11/07/19    Authorization Type [employer]BS    Authorization - Visit Number --    Authorization - Number of Visits --    PT Start Time 0935    PT Stop Time 1004    PT Time Calculation (min) 29 min    Activity Tolerance Patient tolerated treatment well;Patient limited by pain    Behavior During Therapy [facility] for tasks assessed/performed           Past Medical History:  Diagnosis Date  . Glaucoma   . Right flank pain   . Vision loss of right eye     Past Surgical History:  Procedure Laterality Date  . GLAUCOMA SURGERY  In 11/2014   with subsequent loss of vision in right eye    There were no vitals filed for this visit.    Subjective Assessment - 09/12/19 0939    Subjective Patient reports 2 mos ago onset of pain in L hip and back.  Pain is improving and she can now reduce pain with lying down. No known injury.  Pain interferes with her ability to sit, stand and walk.  She shows a lateral shift to Rt and is visibly uncomfortably , shifted off her L hip in sitting.  Eval started late (20 min) due to lengthy time checking in with language interpreter.    Patient is accompained by: Interpreter    Pertinent History L hip pain, loss of vision    Limitations Sitting;Lifting;House hold activities;Standing;Walking    Patient Stated Goals Patient would like to be able to have less pain    Currently in Pain? Yes    Pain Score 8     Pain Location Back    Pain Orientation Left;Lower    Pain Descriptors / Indicators Aching    Pain Type Chronic pain    Pain  Radiating Towards LLE    Pain Onset More than a month ago    Pain Frequency Constant    Aggravating Factors  bending, lifting    Pain Relieving Factors laying down    Effect of Pain on Daily Activities everything is uncomfortable    Multiple Pain Sites No              [facility] PT Assessment - 09/12/19 0001      Assessment   Medical Diagnosis L sided low back and hip pain, radiculopathy     Referring Provider (PT) Erskine Emery, PA     Onset Date/Surgical Date --   2 mos    Prior Therapy No       Precautions   Precautions None      Balance Screen   Has the patient fallen in the past 6 months No      [place] residence    Additional Comments lives with 52 and 52 yr old       Cognition   Overall Cognitive Status Within Functional Limits for tasks assessed      Sensation   Light Touch Impaired by gross assessment  Additional Comments reports numbness improving in LLE       Posture/Postural Control   Posture Comments lateral shift , Rt trunk SB,       AROM   Lumbar Flexion painful 50% or more     Lumbar Extension 25%     Lumbar - Right Side Bend 3 inches from knee     Lumbar - Left Side Bend 5 inches from knee     Lumbar - Right Rotation WFL     Lumbar - Left Rotation Pain       Strength   Right Hip Flexion 5/5    Left Hip Flexion 3+/5    Right Knee Flexion 5/5    Right Knee Extension 5/5    Left Knee Flexion 4+/5    Left Knee Extension 4/5      Palpation   Spinal mobility NT due to time constraints     Palpation comment pain along Lt Low lumbar region and into Rt glutes , post thigh       Special Tests    Special Tests Lumbar    Other special tests iniitally had Pos SLR on L but after manual distraction, was able to get to 55-60 deg without back pain     Lumbar Tests Slump Test;Prone Knee Bend Test      Slump test   Findings Positive    Side Left      Prone Knee Bend Test   Findings Positive    Side Left                        Objective measurements completed on examination: See above findings.       [facility] Adult PT Treatment/Exercise - 09/12/19 0001      Lumbar Exercises: Stretches   Other Lumbar Stretch Exercise childs pose x 4       Lumbar Exercises: Standing   Other Standing Lumbar Exercises lateral shift correction , hips to wall on the R , painful x 5                   PT Education - 09/12/19 1336    Education Details PT/POC, HEP, disc pathology, lateral shift    Person(s) Educated Patient    Methods Explanation;Tactile cues;Demonstration;Handout    Comprehension Verbalized understanding;Need further instruction            PT Short Term Goals - 09/12/19 1338      PT SHORT TERM GOAL #1   Title Patient will report centralization of pain , min and intermittent in LLE    Time 4    Period Weeks    Status New    Target Date 10/10/19      PT SHORT TERM GOAL #2   Title Patient will be able to sit equally on hips without pain in back, LE    Time 4    Period Weeks    Status New    Target Date 10/10/19      PT SHORT TERM GOAL #3   Title Patient will be I with initial HEP for back , leg, core    Time 4    Period Weeks    Status New    Target Date 10/10/19             PT Long Term Goals - 09/12/19 1340      PT LONG TERM GOAL #1   Title Patient will be I with HEP upon  discharge    Time 8    Period Weeks    Status New    Target Date 11/07/19      PT LONG TERM GOAL #2   Title Patient will report min to no pain in lower back with simple home tasks in her home, ADLs.    Time 8    Period Weeks    Status New    Target Date 11/07/19      PT LONG TERM GOAL #3   Title Pt will show good body mechanics for lifting mod items off the floor to shoulder height    Time 8    Period Weeks    Status New    Target Date 11/07/19      PT LONG TERM GOAL #4   Title Pt will be able  to show 5/5 strength in LEs to maximize gait, squatting, lifting    Time 8     Period Weeks    Status New    Target Date 11/07/19                  Plan - 09/12/19 1343    Clinical Impression Statement Pt presents for low complexity eval of L sided low back and LE pain consistent with lumbar disc derangement. She responded well to long axis distraction of LLE and prone propping on elbows, less LE pain in this position.  Session was abbreviated due to late arrival and increased ttime to interpret check in process. She shows no red flags, but LLE is weaker , lateral shift and numbness in L4/L5 nerve distribution. Was given HEP to work on until she comes back.    Personal Factors and Comorbidities Social Background    Examination-Activity Limitations Bathing;Stairs;Stand;Bed Mobility;Bend;Sit;Transfers;Lift;Sleep;Caring for Others;Carry;Locomotion Level;Squat    Examination-Participation Restrictions Cleaning;Community Activity;Laundry;Interpersonal Relationship    Stability/Clinical Decision Making Stable/Uncomplicated    Clinical Decision Making Low    Rehab Potential Excellent    PT Frequency 2x / week    PT Duration 8 weeks    PT Treatment/Interventions ADLs/Self Care Home Management;Cryotherapy;Electrical Stimulation;Moist Heat;Traction;Ultrasound;Gait training;Functional mobility training;Therapeutic activities;Therapeutic exercise;Neuromuscular re-education;Wheelchair mobility training;Taping;Manual techniques;Dry needling;Spinal Manipulations    PT Next Visit Plan update sx, finish MMT hips, core.  Check HEP, manual /prone    PT Home Exercise Plan prone prop on elbows to childs pose , R lateral correction    Consulted and Agree with Plan of Care Patient           Patient will benefit from skilled therapeutic intervention in order to improve the following deficits and impairments:  Abnormal gait, Decreased mobility, Decreased range of motion, Decreased strength, Increased fascial restricitons, Impaired flexibility, Postural dysfunction, Pain, Impaired  sensation, Difficulty walking  Visit Diagnosis: Radiculopathy, lumbar region  Other disturbances of skin sensation  Muscle weakness (generalized)  Acute bilateral low back pain with left-sided sciatica     Problem List Patient Active Problem List   Diagnosis Date Noted  . History of hepatitis B virus infection 09/03/2019  . Left hip pain 08/21/2019  . Left knee pain 08/21/2019  . Pallor of nail bed 12/23/2018  . Seasonal allergies 12/23/2018  . Healthcare maintenance 12/23/2018  . Menopause 12/23/2018  . 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 Glaucoma 11/26/2015  . 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 Right flank pain, chronic 11/26/2015    Alden Bensinger 09/12/2019, 1:50 PM  [facility] [address] [place], [place], [address] Phone: [phone]   Fax:  [phone]  Name: Rosemae Mcquown MRN: [id number] Date of Birth: 01-02-1966   Karie Mainland, PT 09/12/19 1:51 PM Phone: [phone] Fax: [phone]

## 2019-09-15 NOTE — Telephone Encounter (Signed)
My assumption is this was to be completed the by specialist as her insurance asks them to complete paperwork as well. I have not received any  new paperwork.  Terisa Starr, MD  Family Medicine Teaching Service

## 2019-09-15 NOTE — Telephone Encounter (Signed)
Patients daughter is calling back and said that [employer] has received the original paperwork that was filled out but they sent another set of forms to be filled out on 09/11/19. She would like to know if Dr. Manson Passey has received this form. I am routing to white team because patient is Crecsenzo's patient and was not sure if this was something that had to be filled out by him and then signed by Dr. Manson Passey  Please call patient's daughter at [phone].

## 2019-09-15 NOTE — Telephone Encounter (Signed)
Dr. Nobie Putnam or Dr. Manson Passey have you seen any paperwork that would have been received on 6/24-6/25? Sunday Spillers, CMA

## 2019-09-16 NOTE — Telephone Encounter (Signed)
I spoke with the patient's daughter regarding the paperwork.  She says that the company is asked that the paperwork be more specific but is unsure of what they mean by this.  There are recommendations on the fax cover sheet regarding what changes they need.  This has been placed in Dr. Theora Gianotti box per her request.  I also informed patient that she still had the FMLA paperwork that I had filled out in the front office waiting to be picked up.  She requested that this paperwork be added to the paperwork that Dr. Manson Passey is working on and all be faxed at 1 time to [employer].  This paperwork has been placed with Dr. Theora Gianotti paperwork

## 2019-09-16 NOTE — Telephone Encounter (Signed)
Addended form. Reviewed, completed, and signed form.  Note routed to RN team inbasket and placed completed form in Clinic RN's office (wall pocket above desk).  Westley Chandler, MD

## 2019-09-16 NOTE — Telephone Encounter (Signed)
Paperwork is in Dr. Geanie Logan box to be filled out. Pt's daughter has been informed. Sunday Spillers, CMA

## 2019-09-17 NOTE — Telephone Encounter (Signed)
Forms faxed to provided number. Copy made and placed in batch scanning.   Chrissa Meetze C Lovinia Snare, RN  

## 2019-09-23 ENCOUNTER — Encounter: Payer: Self-pay | Admitting: Family Medicine

## 2019-09-23 ENCOUNTER — Ambulatory Visit (INDEPENDENT_AMBULATORY_CARE_PROVIDER_SITE_OTHER): Payer: BC Managed Care – PPO | Admitting: Family Medicine

## 2019-09-23 ENCOUNTER — Other Ambulatory Visit: Payer: Self-pay

## 2019-09-23 VITALS — BP 102/70 | HR 104 | Wt 163.0 lb

## 2019-09-23 DIAGNOSIS — K5900 Constipation, unspecified: Secondary | ICD-10-CM | POA: Insufficient documentation

## 2019-09-23 DIAGNOSIS — Z1211 Encounter for screening for malignant neoplasm of colon: Secondary | ICD-10-CM

## 2019-09-23 DIAGNOSIS — R350 Frequency of micturition: Secondary | ICD-10-CM | POA: Diagnosis not present

## 2019-09-23 DIAGNOSIS — M25552 Pain in left hip: Secondary | ICD-10-CM | POA: Diagnosis not present

## 2019-09-23 LAB — POCT URINALYSIS DIP (MANUAL ENTRY)
Bilirubin, UA: NEGATIVE
Blood, UA: NEGATIVE
Glucose, UA: NEGATIVE mg/dL
Ketones, POC UA: NEGATIVE mg/dL
Nitrite, UA: NEGATIVE
Protein Ur, POC: NEGATIVE mg/dL
Spec Grav, UA: 1.02 (ref 1.010–1.025)
Urobilinogen, UA: 0.2 E.U./dL
pH, UA: 6.5 (ref 5.0–8.0)

## 2019-09-23 LAB — POCT UA - MICROSCOPIC ONLY

## 2019-09-23 MED ORDER — POLYETHYLENE GLYCOL 3350 17 GM/SCOOP PO POWD: 17.0000 g | Freq: Three times a day (TID) | ORAL | 1 refills | Status: DC | PRN

## 2019-09-23 NOTE — Assessment & Plan Note (Signed)
Patient with continuation of left hip pain which radiates all the way down to her ankle.  Patient reports she took the naproxen which helped but she ran out of that last Thursday and since then the pain has worsened.  Patient has seen orthopedics who recommended physical therapy and follow-up in 4 weeks.  Patient has yet to schedule follow-up visit with orthopedics around the 19th of July.  Patient has had x-ray of hip and lumbar spine which showed no acute findings. -Encouraged use of Tylenol arthritic pain -Encouraged follow-up with orthopedics as well as attendance to physical therapy -Consider more advanced imaging such as MRI of hip or lumbar spine if pain does not resolve

## 2019-09-23 NOTE — Assessment & Plan Note (Addendum)
Patient complaining of increased urinary frequency recently.  Says that she is able to initiate urination without difficulty but that she feels like she cannot urinate large amounts so she is increased in the number of times she is urinating.  She denies any pain or burning with urination, blood in her urine.  Denies any issues with incontinence. -Urinalysis ordered -If UA comes back indicating UTI antibiotic should be prescribed

## 2019-09-23 NOTE — Addendum Note (Signed)
Addended by: Jennette Bill on: 09/23/2019 03:31 PM   Modules accepted: Orders

## 2019-09-23 NOTE — Progress Notes (Signed)
SUBJECTIVE:   CHIEF COMPLAINT / HPI:  Patient presents for follow-up visit for her left-sided hip and lower extremity pain  Hip pain  Patient complaining of continued hip pain since last evaluation.  She reports that the naproxen helped with her pain and that she also felt benefit from the steroid Dosepak but since she has run out of naproxen the pain has gotten worse again.  Patient reports it is worse with ambulation and better with rest.  Denies any heat or or erythema.  Reports that the only medication she has taken are the ones that we have prescribed her.  Constipation  Patient reports her constipation is still bothering her from her previous visit with Dr. Manson Passey.  She reports she took the MiraLAX 17 g packet 3 times a day and says that she would have bowel movement every 2 to 3 days with that regimen.  She reports that last week she only had 1 bowel movement.  Is concerned because she is still having difficulty with bowel movements.  Urinary difficulties  Patient reports she is not having any difficulty initiating urine but she is complaining that when she urinates she urinates small amounts and is had an increase in frequency.  Denies any burning with urination or abnormal discharge.   OBJECTIVE:   BP 102/70   Pulse (!) 104   Wt 163 lb (73.9 kg)   SpO2 99%   BMI 24.78 kg/m   General: Well-appearing, no acute distress, sitting in chair next to exam table Respiratory: Normal work of breathing Cardiac: Regular rate and rhythm, no murmurs appreciated Abdomen: Soft, patient reports tenderness to palpation on left side which radiates to her hip and then down her left leg MSK: Left lower extremity is tender to palpate along anterior lateral surface of the thigh as well as posterior aspect of knee and calf all the way down to her ankle.  No decrease in strength.  Reflexes are equal bilaterally.  ASSESSMENT/PLAN:   Left hip pain Patient with continuation of left hip pain which  radiates all the way down to her ankle.  Patient reports she took the naproxen which helped but she ran out of that last Thursday and since then the pain has worsened.  Patient has seen orthopedics who recommended physical therapy and follow-up in 4 weeks.  Patient has yet to schedule follow-up visit with orthopedics around the 19th of July.  Patient has had x-ray of hip and lumbar spine which showed no acute findings. -Encouraged use of Tylenol arthritic pain -Encouraged follow-up with orthopedics as well as attendance to physical therapy -Consider more advanced imaging such as MRI of hip or lumbar spine if pain does not resolve    Increased urinary frequency Patient complaining of increased urinary frequency recently.  Says that she is able to initiate urination without difficulty but that she feels like she cannot urinate large amounts so she is increased in the number of times she is urinating.  She denies any pain or burning with urination, blood in her urine.  Denies any issues with incontinence. -Urinalysis ordered -If UA comes back indicating UTI antibiotic should be prescribed  Constipation Patient reports continued issues with constipation.  She says that she took the MiraLAX 3 times a day as prescribed by Dr. Manson Passey and that she is still having bowel movements every 2 to 3 days but that last week she only had 1 bowel movement. -Continue MiraLAX 3 times a day -Colonoscopy referral ordered given patient is  due for colonoscopy at this time   Derrel Nip, MD [facility]

## 2019-09-23 NOTE — Assessment & Plan Note (Addendum)
Patient reports continued issues with constipation.  She says that she took the MiraLAX 3 times a day as prescribed by Dr. Manson Passey and that she is still having bowel movements every 2 to 3 days but that last week she only had 1 bowel movement. -Continue MiraLAX 3 times a day -Colonoscopy referral ordered given patient is due for colonoscopy at this time

## 2019-09-23 NOTE — Patient Instructions (Addendum)
It was a pleasure to see you today.  I am sorry you are still having issues with your left hip pain.  I would like for you to start taking Tylenol arthritic pain 3 times a day and see if that helps with the hip pain.  Please do not take more than 4 g in 1 day.  I would also like for you to call and schedule a follow-up appointment with the orthopedic doctor regarding this hip pain.  He may request more advanced imaging such as MRI.  I would also encourage you to follow-up with your physical therapy sessions.  Regarding your constipation I would like for you to continue the MiraLAX 17 g 3 times a day.  I am going to put a referral in for you to have a colonoscopy scheduled.  The stomach doctors will be calling to schedule that.  Regarding your urinary frequency I am going to collect a urine sample today.  I will call you if there are any abnormalities in the urine.  I hope you get to feeling better and have a wonderful afternoon!

## 2019-09-24 LAB — BASIC METABOLIC PANEL WITH GFR
BUN/Creatinine Ratio: 14 (ref 9–23)
BUN: 9 mg/dL (ref 6–24)
CO2: 22 mmol/L (ref 20–29)
Calcium: 9.7 mg/dL (ref 8.7–10.2)
Chloride: 107 mmol/L — ABNORMAL HIGH (ref 96–106)
Creatinine, Ser: 0.64 mg/dL (ref 0.57–1.00)
GFR calc Af Amer: 119 mL/min/1.73
GFR calc non Af Amer: 103 mL/min/1.73
Glucose: 81 mg/dL (ref 65–99)
Potassium: 4.2 mmol/L (ref 3.5–5.2)
Sodium: 143 mmol/L (ref 134–144)

## 2019-09-29 ENCOUNTER — Other Ambulatory Visit: Payer: Self-pay

## 2019-09-29 ENCOUNTER — Ambulatory Visit: Payer: BC Managed Care – PPO | Attending: Physician Assistant | Admitting: Physical Therapy

## 2019-09-29 DIAGNOSIS — M5442 Lumbago with sciatica, left side: Secondary | ICD-10-CM | POA: Diagnosis present

## 2019-09-29 DIAGNOSIS — M6281 Muscle weakness (generalized): Secondary | ICD-10-CM | POA: Insufficient documentation

## 2019-09-29 DIAGNOSIS — R208 Other disturbances of skin sensation: Secondary | ICD-10-CM

## 2019-09-29 DIAGNOSIS — M5416 Radiculopathy, lumbar region: Secondary | ICD-10-CM

## 2019-09-29 DIAGNOSIS — R209 Unspecified disturbances of skin sensation: Secondary | ICD-10-CM | POA: Diagnosis present

## 2019-09-29 NOTE — Patient Instructions (Signed)
Access Code: [email or web address]: [email or web address]: 07/12/2021Prepared by: Victorino Dike PaaExercises  Supine Posterior Pelvic Tilt - 2 x daily - 7 x weekly - 1 sets - 10 reps - 10 hold  Sidelying Quadratus Lumborum Stretch on Table - 2 x daily - 7 x weekly - 1 sets - 1 reps - 5 min hold

## 2019-09-29 NOTE — Therapy (Signed)
[facility] [address] [place], [place], [address] Phone: [phone]   Fax:  [phone]  Physical Therapy Treatment  Patient Details  Name: Carrie Rivas MRN: [id number] Date of Birth: 30-Sep-1965 Referring Provider (PT): Richardean Canal, [place]    Encounter Date: 09/29/2019   PT End of Session - 09/29/19 1229    Visit Number 2    Number of Visits 12    Date for PT Re-Evaluation 11/07/19    Authorization Type BCBS    PT Start Time 1215    PT Stop Time 1253    PT Time Calculation (min) 38 min    Activity Tolerance Patient tolerated treatment well;Patient limited by pain    Behavior During Therapy [facility] for tasks assessed/performed           Past Medical History:  Diagnosis Date  . Glaucoma   . Right flank pain   . Vision loss of right eye     Past Surgical History:  Procedure Laterality Date  . GLAUCOMA SURGERY  In 11/2014   with subsequent loss of vision in right eye    There were no vitals filed for this visit.   Subjective Assessment - 09/29/19 1223    Subjective Pain is 10/10 today.  Saw MD for increased urinary frequency.  Daughter interpreting today.  Did the exercise for lateral shift correction but no better.              [facility] Adult PT Treatment/Exercise - 09/29/19 0001      Self-Care   Self-Care Heat/Ice Application;Other Self-Care Comments    Heat/Ice Application ice for pain     Other Self-Care Comments  positioning, rationale, referred pain , lateral shift       Lumbar Exercises: Stretches   Lower Trunk Rotation Limitations x 10     Pelvic Tilt 10 reps    Pelvic Tilt Limitations max cues     Other Lumbar Stretch Exercise sidelying for manual       Manual Therapy   Manual Therapy Soft tissue mobilization;Manual Traction    Soft tissue mobilization L lumbar paraspinals     Manual Traction LLE LAD x 5           Cold pack x 8 min legs on high bolster           PT Short Term Goals  - 09/12/19 1338      PT SHORT TERM GOAL #1   Title Patient will report centralization of pain , min and intermittent in LLE    Time 4    Period Weeks    Status New    Target Date 10/10/19      PT SHORT TERM GOAL #2   Title Patient will be able to sit equally on hips without pain in back, LE    Time 4    Period Weeks    Status New    Target Date 10/10/19      PT SHORT TERM GOAL #3   Title Patient will be I with initial HEP for back , leg, core    Time 4    Period Weeks    Status New    Target Date 10/10/19             PT Long Term Goals - 09/12/19 1340      PT LONG TERM GOAL #1   Title Patient will be I with HEP upon discharge    Time 8    Period  Weeks    Status New    Target Date 11/07/19      PT LONG TERM GOAL #2   Title Patient will report min to no pain in lower back with simple home tasks in her home, ADLs.    Time 8    Period Weeks    Status New    Target Date 11/07/19      PT LONG TERM GOAL #3   Title Pt will show good body mechanics for lifting mod items off the floor to shoulder height    Time 8    Period Weeks    Status New    Target Date 11/07/19      PT LONG TERM GOAL #4   Title Pt will be able  to show 5/5 strength in LEs to maximize gait, squatting, lifting    Time 8    Period Weeks    Status New    Target Date 11/07/19                 Plan - 09/29/19 1254    Clinical Impression Statement Pt unable to tolerate exercise today.  Unable to lie prone but did appear ot have some relief as previous with LAD and Rt sidelying to elongate L side.  L parapsinals in spasm.  Able to calm her pain down slightly by propping at 90/90 with ice under lumbar.  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    PT Treatment/Interventions ADLs/Self Care Home Management;Cryotherapy;Electrical Stimulation;Moist Heat;Traction;Ultrasound;Gait  training;Functional mobility training;Therapeutic activities;Therapeutic exercise;Neuromuscular re-education;Wheelchair mobility training;Taping;Manual techniques;Dry needling;Spinal Manipulations    PT Next Visit Plan update sx, finish MMT hips, core.  Check HEP, manual /prone    PT Home Exercise Plan prone prop on elbows to childs pose , R lateral correction, pelvic tilt, SL QL stretch    Consulted and Agree with Plan of Care Patient           Patient will benefit from skilled therapeutic intervention in order to improve the following deficits and impairments:  Abnormal gait, Decreased mobility, Decreased range of motion, Decreased strength, Increased fascial restricitons, Impaired flexibility, Postural dysfunction, Pain, Impaired sensation, Difficulty walking  Visit Diagnosis: Radiculopathy, lumbar region  Other disturbances of skin sensation  Muscle weakness (generalized)  Acute bilateral low back pain with left-sided sciatica     Problem List Patient Active Problem List   Diagnosis Date Noted  . Increased urinary frequency 09/23/2019  . Constipation 09/23/2019  . History of hepatitis B virus infection 09/03/2019  . Left hip pain 08/21/2019  . Left knee pain 08/21/2019  . Pallor of nail bed 12/23/2018  . Seasonal allergies 12/23/2018  . Healthcare maintenance 12/23/2018  . Menopause 12/23/2018  . 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 Glaucoma 11/26/2015  . 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 Right flank pain, chronic 11/26/2015    Carrie Rivas 09/29/2019, 1:00 PM  [facility] [address] [place], [place], [address] Phone: [phone]   Fax:  [phone]  Name: Carrie Rivas MRN: [id number] Date of Birth: 1965/08/05  Karie Mainland, PT 09/29/19 1:01 PM Phone: [phone] Fax: [phone]

## 2019-10-01 ENCOUNTER — Other Ambulatory Visit: Payer: Self-pay

## 2019-10-01 ENCOUNTER — Ambulatory Visit: Payer: BC Managed Care – PPO | Admitting: Physical Therapy

## 2019-10-01 ENCOUNTER — Encounter: Payer: Self-pay | Admitting: Physical Therapy

## 2019-10-01 DIAGNOSIS — M5416 Radiculopathy, lumbar region: Secondary | ICD-10-CM

## 2019-10-01 DIAGNOSIS — M6281 Muscle weakness (generalized): Secondary | ICD-10-CM

## 2019-10-01 DIAGNOSIS — M5442 Lumbago with sciatica, left side: Secondary | ICD-10-CM

## 2019-10-01 DIAGNOSIS — R208 Other disturbances of skin sensation: Secondary | ICD-10-CM

## 2019-10-01 NOTE — Therapy (Signed)
[facility] [address] [place], [place], [address] Phone: [phone]   Fax:  [phone]  Physical Therapy Treatment  Patient Details  Name: Carrie Rivas MRN: [id number] Date of Birth: 1965/07/17 Referring Provider (PT): Richardean Canal, [place]    Encounter Date: 10/01/2019   PT End of Session - 10/01/19 1141    Visit Number 3    Number of Visits 12    Date for PT Re-Evaluation 11/07/19    Authorization Type BCBS    PT Start Time 1102    PT Stop Time 1141    PT Time Calculation (min) 39 min    Activity Tolerance Patient limited by pain    Behavior During Therapy [facility] for tasks assessed/performed           Past Medical History:  Diagnosis Date   Glaucoma    Right flank pain    Vision loss of right eye     Past Surgical History:  Procedure Laterality Date   GLAUCOMA SURGERY  In 11/2014   with subsequent loss of vision in right eye    There were no vitals filed for this visit.   Subjective Assessment - 10/01/19 1104    Subjective Reports she feels a little better but reports 10/10. Reports she walks around but cannot do her HEP due to pain.    Patient is accompained by: Family member   daughter interpreting   Patient Stated Goals Patient would like to be able to have less pain    Currently in Pain? Yes    Pain Score 10-Worst pain ever    Pain Location Back    Pain Orientation Left;Lower    Aggravating Factors  sitting, bending    Pain Relieving Factors laying on Rt side                             [facility] Adult PT Treatment/Exercise - 10/01/19 0001      Lumbar Exercises: Stretches   Other Lumbar Stretch Exercise Lt sidelying over 2 pillows for Rt pelvic sideglide      Modalities   Modalities Cryotherapy      Cryotherapy   Number Minutes Cryotherapy 10 Minutes   in Lt sidelying- pain monitored   Cryotherapy Location Lumbar Spine    Type of Cryotherapy Ice pack      Manual Therapy    Manual Therapy Joint mobilization    Joint Mobilization Rt upper sacral quadrant PA spring hold 2 min    Soft tissue mobilization Lt lumbar paraspinals in Lt sidelying over pillows    Manual Traction LLE LAD x 5                     PT Short Term Goals - 09/12/19 1338      PT SHORT TERM GOAL #1   Title Patient will report centralization of pain , min and intermittent in LLE    Time 4    Period Weeks    Status New    Target Date 10/10/19      PT SHORT TERM GOAL #2   Title Patient will be able to sit equally on hips without pain in back, LE    Time 4    Period Weeks    Status New    Target Date 10/10/19      PT SHORT TERM GOAL #3   Title Patient will be I with initial HEP for back ,  leg, core    Time 4    Period Weeks    Status New    Target Date 10/10/19             PT Long Term Goals - 09/12/19 1340      PT LONG TERM GOAL #1   Title Patient will be I with HEP upon discharge    Time 8    Period Weeks    Status New    Target Date 11/07/19      PT LONG TERM GOAL #2   Title Patient will report min to no pain in lower back with simple home tasks in her home, ADLs.    Time 8    Period Weeks    Status New    Target Date 11/07/19      PT LONG TERM GOAL #3   Title Pt will show good body mechanics for lifting mod items off the floor to shoulder height    Time 8    Period Weeks    Status New    Target Date 11/07/19      PT LONG TERM GOAL #4   Title Pt will be able  to show 5/5 strength in LEs to maximize gait, squatting, lifting    Time 8    Period Weeks    Status New    Target Date 11/07/19                 Plan - 10/01/19 1132    Clinical Impression Statement Was able to fully centralize pain in Lt sidelying over 2 pillows where she spent most of the session. Did turn prone for SIJ spring but began to feel peripheralization of pain. Pt was more relaxed and reported feeling much better in sidelying. I asked her to do this at least 2x/day for at  least 5 min or more if needed to avoid peripheralization. Agreed to try her exercises at home.    PT Treatment/Interventions ADLs/Self Care Home Management;Cryotherapy;Electrical Stimulation;Moist Heat;Traction;Ultrasound;Gait training;Functional mobility training;Therapeutic activities;Therapeutic exercise;Neuromuscular re-education;Wheelchair mobility training;Taping;Manual techniques;Dry needling;Spinal Manipulations    PT Next Visit Plan continue manual/shift correction as tolerated, progress when centralized    PT Home Exercise Plan prone prop on elbows to childs pose , R lateral correction, pelvic tilt, SL QL stretch    Consulted and Agree with Plan of Care Patient;Family member/caregiver    Family Member Consulted Daughter           Patient will benefit from skilled therapeutic intervention in order to improve the following deficits and impairments:  Abnormal gait, Decreased mobility, Decreased range of motion, Decreased strength, Increased fascial restricitons, Impaired flexibility, Postural dysfunction, Pain, Impaired sensation, Difficulty walking  Visit Diagnosis: Radiculopathy, lumbar region  Other disturbances of skin sensation  Muscle weakness (generalized)  Acute bilateral low back pain with left-sided sciatica     Problem List Patient Active Problem List   Diagnosis Date Noted   Increased urinary frequency 09/23/2019   Constipation 09/23/2019   History of hepatitis B virus infection 09/03/2019   Left hip pain 08/21/2019   Left knee pain 08/21/2019   Pallor of nail bed 12/23/2018   Seasonal allergies 12/23/2018   Healthcare maintenance 12/23/2018   Menopause 12/23/2018   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 Taelynn Mcelhannon PT, DPT 10/01/19 12:53 PM   [facility] [address]  [address] [place], [place],  [address] Phone: [phone]   Fax:  [phone]  Name: Carrie Rivas MRN: [id number] Date of Birth: [birth year]

## 2019-10-02 NOTE — Telephone Encounter (Signed)
Paperwork returned back to our office from patients employer. Unfortunately, there is no "employee signature" on paperwork. Dr. Manson Passey is unsure if this is the reason for return. I have called the patient to come by our office to sign. I had to leave VM using arabic interpreter [phone].

## 2019-10-06 ENCOUNTER — Other Ambulatory Visit: Payer: Self-pay

## 2019-10-06 ENCOUNTER — Telehealth: Payer: Self-pay | Admitting: Orthopaedic Surgery

## 2019-10-06 ENCOUNTER — Ambulatory Visit: Payer: BC Managed Care – PPO | Admitting: Physical Therapy

## 2019-10-06 ENCOUNTER — Ambulatory Visit: Payer: BC Managed Care – PPO | Admitting: Physician Assistant

## 2019-10-06 ENCOUNTER — Encounter: Payer: Self-pay | Admitting: Physical Therapy

## 2019-10-06 DIAGNOSIS — M5416 Radiculopathy, lumbar region: Secondary | ICD-10-CM | POA: Diagnosis not present

## 2019-10-06 DIAGNOSIS — R208 Other disturbances of skin sensation: Secondary | ICD-10-CM

## 2019-10-06 DIAGNOSIS — M25552 Pain in left hip: Secondary | ICD-10-CM

## 2019-10-06 DIAGNOSIS — M6281 Muscle weakness (generalized): Secondary | ICD-10-CM

## 2019-10-06 DIAGNOSIS — M5442 Lumbago with sciatica, left side: Secondary | ICD-10-CM

## 2019-10-06 MED ORDER — NAPROXEN 500 MG PO TABS: 500.0000 mg | ORAL_TABLET | Freq: Two times a day (BID) | ORAL | 0 refills | Status: DC

## 2019-10-06 NOTE — Telephone Encounter (Signed)
Left message requesting call back for type of medication she needs refilled. Missed appointment this morning was rescheduled for 8/2.

## 2019-10-06 NOTE — Therapy (Signed)
[facility] [address] [place], [place], [address] Phone: [phone]   Fax:  [phone]  Physical Therapy Treatment  Patient Details  Name: Carrie Rivas MRN: [id number] Date of Birth: 1966-03-20 Referring Provider (PT): Richardean Canal, [place]    Encounter Date: 10/06/2019   PT End of Session - 10/06/19 1127    Visit Number 4    Number of Visits 12    Date for PT Re-Evaluation 11/07/19    PT Start Time 1051    PT Stop Time 1140    PT Time Calculation (min) 49 min    Activity Tolerance Patient limited by pain    Behavior During Therapy [facility] for tasks assessed/performed           Past Medical History:  Diagnosis Date  . Glaucoma   . Right flank pain   . Vision loss of right eye     Past Surgical History:  Procedure Laterality Date  . GLAUCOMA SURGERY  In 11/2014   with subsequent loss of vision in right eye    There were no vitals filed for this visit.   Subjective Assessment - 10/06/19 1054    Subjective Patient appears in more distress than previous sessions.  10/10 . Sees Dr. Magnus Ivan 8/2    Currently in Pain? Yes    Pain Score 10-Worst pain ever    Pain Location Back    Pain Orientation Left    Pain Descriptors / Indicators Aching    Pain Type Chronic pain    Pain Onset More than a month ago    Pain Frequency Constant    Aggravating Factors  siting, bending    Pain Relieving Factors Rt side and walking sometimes              [facility] PT Assessment - 10/06/19 0001      Strength   Left Knee Flexion 4+/5    Left Knee Extension 4/5    Left Ankle Dorsiflexion 4/5             [facility] Adult PT Treatment/Exercise - 10/06/19 0001      Self-Care   Heat/Ice Application ice for pain relief    Other Self-Care Comments  body mechanics, nerve pain and low back as source , HEP       Lumbar Exercises: Stretches   Other Lumbar Stretch Exercise seated sidebending end of session     Other Lumbar Stretch  Exercise Lt sidelying over 2 pillows for Rt pelvic correction      Modalities   Modalities Cryotherapy      Cryotherapy   Number Minutes Cryotherapy 15 Minutes    Cryotherapy Location Lumbar Spine    Type of Cryotherapy Ice pack      Manual Therapy   Manual therapy comments PROM L hip ER/IR     Soft tissue mobilization Lt lumbar paraspinals in Lt sidelying over pillows    Manual Traction LLE LAD x 5                   PT Education - 10/06/19 1127    Education Details IFC for pain relief, mobility and supine to sit to supine    Person(s) Educated Patient    Methods Explanation;Demonstration    Comprehension Verbalized understanding;Returned demonstration            PT Short Term Goals - 09/12/19 1338      PT SHORT TERM GOAL #1   Title Patient will report centralization  of pain , min and intermittent in LLE    Time 4    Period Weeks    Status New    Target Date 10/10/19      PT SHORT TERM GOAL #2   Title Patient will be able to sit equally on hips without pain in back, LE    Time 4    Period Weeks    Status New    Target Date 10/10/19      PT SHORT TERM GOAL #3   Title Patient will be I with initial HEP for back , leg, core    Time 4    Period Weeks    Status New    Target Date 10/10/19             PT Long Term Goals - 09/12/19 1340      PT LONG TERM GOAL #1   Title Patient will be I with HEP upon discharge    Time 8    Period Weeks    Status New    Target Date 11/07/19      PT LONG TERM GOAL #2   Title Patient will report min to no pain in lower back with simple home tasks in her home, ADLs.    Time 8    Period Weeks    Status New    Target Date 11/07/19      PT LONG TERM GOAL #3   Title Pt will show good body mechanics for lifting mod items off the floor to shoulder height    Time 8    Period Weeks    Status New    Target Date 11/07/19      PT LONG TERM GOAL #4   Title Pt will be able  to show 5/5 strength in LEs to maximize gait,  squatting, lifting    Time 8    Period Weeks    Status New    Target Date 11/07/19                 Plan - 10/06/19 1149    Clinical Impression Statement Patient able to reduce pain minimally with positional exercises and IFC.  Understands the source of her pain (with help of interpreter).  Encouraged her to get into these positions at home and use proper mechanics when changing positions.  She is awaiting her pain medicine as well as her appt with MD.    Examination-Activity Limitations Bathing;Stairs;Stand;Bed Mobility;Bend;Sit;Transfers;Lift;Sleep;Caring for Others;Carry;Locomotion Level;Squat    PT Treatment/Interventions ADLs/Self Care Home Management;Cryotherapy;Electrical Stimulation;Moist Heat;Traction;Ultrasound;Gait training;Functional mobility training;Therapeutic activities;Therapeutic exercise;Neuromuscular re-education;Wheelchair mobility training;Taping;Manual techniques;Dry needling;Spinal Manipulations    PT Next Visit Plan continue manual/shift correction as tolerated, progress when centralized, IFC and cold    PT Home Exercise Plan prone prop on elbows to childs pose , R lateral correction, pelvic tilt, SL QL stretch    Consulted and Agree with Plan of Care Patient;Family member/caregiver    Family Member Consulted Daughter           Patient will benefit from skilled therapeutic intervention in order to improve the following deficits and impairments:  Abnormal gait, Decreased mobility, Decreased range of motion, Decreased strength, Increased fascial restricitons, Impaired flexibility, Postural dysfunction, Pain, Impaired sensation, Difficulty walking  Visit Diagnosis: Radiculopathy, lumbar region  Other disturbances of skin sensation  Muscle weakness (generalized)  Acute bilateral low back pain with left-sided sciatica     Problem List Patient Active Problem List   Diagnosis Date Noted  . Increased  urinary frequency 09/23/2019  . Constipation 09/23/2019   . History of hepatitis B virus infection 09/03/2019  . Left hip pain 08/21/2019  . Left knee pain 08/21/2019  . Pallor of nail bed 12/23/2018  . Seasonal allergies 12/23/2018  . Healthcare maintenance 12/23/2018  . Menopause 12/23/2018  . 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 Glaucoma 11/26/2015  . 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 Right flank pain, chronic 11/26/2015    Treyveon Mochizuki 10/06/2019, 11:55 AM  [facility] [address] [place], [place], [address] Phone: [phone]   Fax:  [phone]  Name: Carrie Rivas MRN: [id number] Date of Birth: [birth year]  Karie Mainland, PT 10/06/19 11:56 AM Phone: [phone] Fax: [phone]

## 2019-10-06 NOTE — Telephone Encounter (Signed)
Patient's daughter Adaw called advised patient need Rx refilled for pain her medicine. The number to contact Adaw is [phone]

## 2019-10-06 NOTE — Telephone Encounter (Signed)
Only gave her Flexeril and medrol . No pain meds . Did not keep appointment this morning.

## 2019-10-07 NOTE — Telephone Encounter (Signed)
LVM #2 requesting call back. 

## 2019-10-10 ENCOUNTER — Encounter: Payer: Self-pay | Admitting: Physical Therapy

## 2019-10-10 ENCOUNTER — Ambulatory Visit: Payer: BC Managed Care – PPO | Admitting: Physical Therapy

## 2019-10-10 ENCOUNTER — Other Ambulatory Visit: Payer: Self-pay

## 2019-10-10 DIAGNOSIS — M5442 Lumbago with sciatica, left side: Secondary | ICD-10-CM

## 2019-10-10 DIAGNOSIS — M5416 Radiculopathy, lumbar region: Secondary | ICD-10-CM

## 2019-10-10 DIAGNOSIS — M6281 Muscle weakness (generalized): Secondary | ICD-10-CM

## 2019-10-10 DIAGNOSIS — R208 Other disturbances of skin sensation: Secondary | ICD-10-CM

## 2019-10-10 NOTE — Therapy (Addendum)
[place], [place], [address] Phone: [phone]   Fax:  [phone]  Physical Therapy Treatment/Discharge  Patient Details  Name: Carrie Rivas MRN: [id number] Date of Birth: 1965/09/19 Referring Provider (PT): Erskine Emery, [place]    Encounter Date: 10/10/2019   PT End of Session - 10/10/19 1142    Visit Number 5    Number of Visits 12    Date for PT Re-Evaluation 11/07/19    Authorization Type BCBS    PT Start Time 1110    PT Stop Time 1142    PT Time Calculation (min) 32 min    Activity Tolerance Patient limited by pain    Behavior During Therapy Anxious           Past Medical History:  Diagnosis Date  . Glaucoma   . Right flank pain   . Vision loss of right eye     Past Surgical History:  Procedure Laterality Date  . GLAUCOMA SURGERY  In 11/2014   with subsequent loss of vision in right eye    There were no vitals filed for this visit.   Subjective Assessment - 10/10/19 1110    Subjective Pt denies change in pain. Has been trying to lay in sidelying at home. Radicular symptoms decrease in sidelying but it comes right back as soon as she gets up.    Patient Stated Goals Patient would like to be able to have less pain                             [facility] Adult PT Treatment/Exercise - 10/10/19 0001      Lumbar Exercises: Stretches   Other Lumbar Stretch Exercise correction for Lt post illium      Manual Therapy   Joint Mobilization Lt upper sacrum PA, correction for Lt post illium, correction for Lt inflare    Soft tissue mobilization Lt hip area                    PT Short Term Goals - 09/12/19 1338      PT SHORT TERM GOAL #1   Title Patient will report centralization of pain , min and intermittent in LLE    Time 4    Period Weeks    Status New    Target Date 10/10/19      PT SHORT TERM GOAL #2   Title Patient will be able to sit equally on hips  without pain in back, LE    Time 4    Period Weeks    Status New    Target Date 10/10/19      PT SHORT TERM GOAL #3   Title Patient will be I with initial HEP for back , leg, core    Time 4    Period Weeks    Status New    Target Date 10/10/19             PT Long Term Goals - 09/12/19 1340      PT LONG TERM GOAL #1   Title Patient will be I with HEP upon discharge    Time 8    Period Weeks    Status New    Target Date 11/07/19      PT LONG TERM GOAL #2   Title Patient will report min to no pain in lower back with simple home tasks in her home, ADLs.  Time 8    Period Weeks    Status New    Target Date 11/07/19      PT LONG TERM GOAL #3   Title Pt will show good body mechanics for lifting mod items off the floor to shoulder height    Time 8    Period Weeks    Status New    Target Date 11/07/19      PT LONG TERM GOAL #4   Title Pt will be able  to show 5/5 strength in LEs to maximize gait, squatting, lifting    Time 8    Period Weeks    Status New    Target Date 11/07/19                 Plan - 10/10/19 1138    Clinical Impression Statement s/s consistent with Lt post illium with Lt lateral shift or pelvis. entire session was focused on manual therapy with instruction for HEP exercise following to reinforce- Lay on Lt side with pillows under hips, hesh method stretch for Lt post illium. pt was able to stand up more readily after treatment and daughter remarked that she looked straighter. Asked her to do her 2 HEP stretches multiple times/day. I do think pt woulc benefit from SI injection on Lt side.    PT Treatment/Interventions ADLs/Self Care Home Management;Cryotherapy;Electrical Stimulation;Moist Heat;Traction;Ultrasound;Gait training;Functional mobility training;Therapeutic activities;Therapeutic exercise;Neuromuscular re-education;Wheelchair mobility training;Taping;Manual techniques;Dry needling;Spinal Manipulations    PT Next Visit Plan outcome of 2  stretches?    Consulted and Agree with Plan of Care Patient;Family member/caregiver    Family Member Consulted Daughter           Patient will benefit from skilled therapeutic intervention in order to improve the following deficits and impairments:  Abnormal gait, Decreased mobility, Decreased range of motion, Decreased strength, Increased fascial restricitons, Impaired flexibility, Postural dysfunction, Pain, Impaired sensation, Difficulty walking  Visit Diagnosis: Radiculopathy, lumbar region  Other disturbances of skin sensation  Muscle weakness (generalized)  Acute bilateral low back pain with left-sided sciatica     Problem List Patient Active Problem List   Diagnosis Date Noted  . Increased urinary frequency 09/23/2019  . Constipation 09/23/2019  . History of hepatitis B virus infection 09/03/2019  . Left hip pain 08/21/2019  . Left knee pain 08/21/2019  . Pallor of nail bed 12/23/2018  . Seasonal allergies 12/23/2018  . Healthcare maintenance 12/23/2018  . Menopause 12/23/2018  . 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 Glaucoma 11/26/2015  . 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 Right flank pain, chronic 11/26/2015    Fleming Prill C. Isiaah Cuervo PT, DPT 10/10/19 1:20 PM   [facility] [address] [place], [place], [address] Phone: [phone]   Fax:  [phone]  Name: Carrie Rivas MRN: [id number] Date of Birth: 05/17/1965 PHYSICAL THERAPY DISCHARGE SUMMARY  Visits from Start of Care: 5  Current functional level related to goals / functional outcomes: See above   Remaining deficits: See above   Education / Equipment: Anatomy of condition, POC, HEP, exercise form/rationale  Plan: Patient agrees to discharge.  Patient goals were not met. Patient is being discharged due to the patient's request.  ?????    Daughter requested D/C per phone call documentation.  Marquinn Meschke C. Shontavia Mickel PT, DPT 11/26/19 2:41  PM

## 2019-10-10 NOTE — Telephone Encounter (Signed)
Pt and daughter came back in to sign paperwork.  Appropriate paperwork signed and faxed to number on form.    Copy made for patient to take with her and updated copy made for batch scanning. Jone Baseman, CMA

## 2019-10-13 ENCOUNTER — Ambulatory Visit (INDEPENDENT_AMBULATORY_CARE_PROVIDER_SITE_OTHER): Payer: BC Managed Care – PPO | Admitting: Family Medicine

## 2019-10-13 ENCOUNTER — Other Ambulatory Visit: Payer: Self-pay

## 2019-10-13 ENCOUNTER — Encounter: Payer: Self-pay | Admitting: Family Medicine

## 2019-10-13 DIAGNOSIS — M25552 Pain in left hip: Secondary | ICD-10-CM

## 2019-10-13 NOTE — Assessment & Plan Note (Addendum)
Patient continues to have left hip pain which radiates to her ankle.  Patient has been seeing rehabilitation at least weekly for the last month with little reported improvement.  Patient reports that she sits around most of the day but does get up to go to the bathroom and do any housework needed.  Feels that she is unable to work at this time because of the pain.  She had a follow-up appointment with orthopedics which she missed the appointment.  It has been rescheduled for August 2.  After her previous visit I discussed with orthopedics her case and they recommended that they see her prior to ordering MRI.  I had given her some Flexeril which she reports helps some with the pain. -Continued encouraged use of Tylenol for the pain -Discussed importance of follow-up with orthopedics on August 2, may have further imaging after that visit -Patient requested refill on Flexeril but after looking into her chart it appears she has a refill waiting for her at the pharmacy.  If there are any issues with that refill I will provide enough to last until she meets with orthopedics. -Work note provided for her until her orthopedic visit

## 2019-10-13 NOTE — Patient Instructions (Addendum)
It was a pleasure to see you today.  I am sorry you are still having issues with your leg pain.  You have an appointment scheduled with the orthopedist on 8/2 at 9 AM.  Please make sure you make this appointment because they will determine the next steps in your diagnostic work-up.  It appears that you have a refill already at the pharmacy for the prescription Flexeril.  I am also going to give you a note for work telling them that you are still experiencing a significant amount of pain and that we have not diagnosed exactly what is wrong and will need off until at least after August 2.  Also make sure you attend your rehab appointments.  Continue to take Tylenol as needed for pain.  Be sure to not sit for long periods of time and try to get up at least once an hour and walk for 15 minutes.  If you have any questions or concerns in between please feel free to call the clinic.

## 2019-10-13 NOTE — Progress Notes (Signed)
    SUBJECTIVE:   CHIEF COMPLAINT / HPI:   Of note patient has family member in the room who wishes to translate, translator was offered but they declined.  Left leg pain Patient continues to have the left leg pain which she says starts at her hip and goes all the way down to her ankle.  Reports that she has tried Tylenol with little relief.  She saw an orthopedist in June who prescribed her Flexeril which she says helps for a short period of time.  Thinks that the physical therapy is not helping much.  We discussed her every day activity for physical therapy and she reports that she gets up and walks around daily to go to the bathroom and to shower but that is about it.  She reports that she does not get up at least every hour and that she tries not to move her hip or her leg.  I asked why she missed her appointment with the orthopedist and she said that she did not know about it.  I informed her of the rescheduled appointment for August 2 9 AM but to arrive at 8:45 AM.  She and her family member both acknowledge this and said that they will be there.  OBJECTIVE:   BP 122/83   Pulse 83   Wt 161 lb 6.4 oz (73.2 kg)   SpO2 98%   BMI 24.54 kg/m   General: Generally well-appearing African-American female, shows intermittent signs of pain Respiratory: Normal work of breathing MSK: Patient with diffuse tenderness to her left lower extremity.  Patient shows signs of pain with palpation to entire left lower extremity along with to flexion of ankle, knee, hip.  No gross focal deformities, no deficits in strength or sensation.  ASSESSMENT/PLAN:   Left hip pain Patient continues to have left hip pain which radiates to her ankle.  Patient has been seeing rehabilitation at least weekly for the last month with little reported improvement.  Patient reports that she sits around most of the day but does get up to go to the bathroom and do any housework needed.  Feels that she is unable to work at this time  because of the pain.  She had a follow-up appointment with orthopedics which she missed the appointment.  It has been rescheduled for August 2.  After her previous visit I discussed with orthopedics her case and they recommended that they see her prior to ordering MRI.  I had given her some Flexeril which she reports helps some with the pain. -Continued encouraged use of Tylenol for the pain -Discussed importance of follow-up with orthopedics on August 2, may have further imaging after that visit -Patient requested refill on Flexeril but after looking into her chart it appears she has a refill waiting for her at the pharmacy.  If there are any issues with that refill I will provide enough to last until she meets with orthopedics. -Work note provided for her until her orthopedic visit     Derrel Nip, MD [facility]

## 2019-10-14 ENCOUNTER — Encounter: Payer: Self-pay | Admitting: Family Medicine

## 2019-10-14 NOTE — Progress Notes (Signed)
Received information about insufficient information on FMLA paperwork. Letter drafted, printed and signed.  Please fax back to appropriate number. Patient has already signed release of information.  .  Note routed to RN team inbasket and placed completed form in Clinic RN's office (wall pocket above desk).  Westley Chandler, MD

## 2019-10-17 ENCOUNTER — Ambulatory Visit: Payer: BC Managed Care – PPO | Admitting: Physical Therapy

## 2019-10-20 ENCOUNTER — Ambulatory Visit: Payer: BC Managed Care – PPO | Attending: Physician Assistant | Admitting: Physical Therapy

## 2019-10-20 ENCOUNTER — Ambulatory Visit: Payer: BC Managed Care – PPO | Admitting: Physician Assistant

## 2019-10-20 NOTE — Telephone Encounter (Signed)
Called patient via Interpreter regarding her missed appt.  This is her first No show.  She was reminded on voicemail of her next appt 10/24/19 and asked to call if she was unable to attend.   Karie Mainland, PT 10/20/19 1:07 PM Phone: [phone] Fax: [phone]

## 2019-10-24 ENCOUNTER — Other Ambulatory Visit: Payer: Self-pay | Admitting: Family Medicine

## 2019-10-24 ENCOUNTER — Ambulatory Visit: Payer: BC Managed Care – PPO | Admitting: Physical Therapy

## 2019-10-24 NOTE — Progress Notes (Signed)
Letter written and faxed with FMLA paperwork requests

## 2019-10-27 ENCOUNTER — Ambulatory Visit (INDEPENDENT_AMBULATORY_CARE_PROVIDER_SITE_OTHER): Payer: BC Managed Care – PPO | Admitting: Family Medicine

## 2019-10-27 ENCOUNTER — Encounter: Payer: Self-pay | Admitting: Family Medicine

## 2019-10-27 ENCOUNTER — Telehealth: Payer: Self-pay | Admitting: Physical Therapy

## 2019-10-27 ENCOUNTER — Other Ambulatory Visit: Payer: Self-pay

## 2019-10-27 ENCOUNTER — Ambulatory Visit: Payer: BC Managed Care – PPO | Admitting: Physical Therapy

## 2019-10-27 VITALS — BP 130/84 | HR 71 | Wt 162.2 lb

## 2019-10-27 DIAGNOSIS — Z0000 Encounter for general adult medical examination without abnormal findings: Secondary | ICD-10-CM

## 2019-10-27 DIAGNOSIS — M545 Low back pain, unspecified: Secondary | ICD-10-CM

## 2019-10-27 DIAGNOSIS — M5416 Radiculopathy, lumbar region: Secondary | ICD-10-CM | POA: Insufficient documentation

## 2019-10-27 MED ORDER — GABAPENTIN 100 MG PO CAPS: 100.0000 mg | ORAL_CAPSULE | Freq: Three times a day (TID) | ORAL | 0 refills | Status: DC | PRN

## 2019-10-27 NOTE — Assessment & Plan Note (Signed)
Should discuss cancer screening on follow-up including at least mammogram and Pap smear to start.

## 2019-10-27 NOTE — Telephone Encounter (Signed)
Called patient regarding no show this AM.  Spoke with daughter who states she cancelled this appt last week. She said she had an MRI and she may need surgery.  DC from PT.   Karie Mainland, PT 10/27/19 12:05 PM Phone: [phone] Fax: [phone]

## 2019-10-27 NOTE — Assessment & Plan Note (Addendum)
81-month history, progressive.  Recently obtained MRI lumbar spine through [facility] showing a severe left lateral recess stenosis compressing on her left L5 nerve root which is likely the culprit of her back pain.  Will refer to neurosurgery to discuss surgical intervention/spinal injection therapy, no associated weakness or neurological changes to suggest urgency or ED necessity at this time.  Rx'd gabapentin 100mg  TID and may titrate up as needed for nerve type pain, educated on common SE.  Can continue Norco, naproxen, and heat PRN.

## 2019-10-27 NOTE — Patient Instructions (Addendum)
It was wonderful to see you today.   You can continue to use the Norco and additional Tylenol (add up the Tylenol in the Norco plus the additional Tylenol, maximum will be 4000 mg a day), and naproxen for pain relief.  I have sent in a medication called gabapentin, this will help with nerve pain which you have.  You can start with 100 mg 3 times daily, if this makes you too drowsy then you can take it at night only.  You can slowly increase by 1 tablet every few days if you are not feeling any improvement to a maximum of 300 mg at one given time.  Heat 20 minutes at a time several times a day on your back can also help.  I have also placed a referral to neurosurgery, if you do not hear from anyone in the next 2 weeks please call clinic to make sure it is still processing.  If you have any sudden onset weakness, severe pain out of your normal proportion, or any changes in bowel/bladder function please go to the ED.

## 2019-10-27 NOTE — Progress Notes (Signed)
° ° °  SUBJECTIVE:   CHIEF COMPLAINT / HPI: Discuss surgery referral/back pain  Requires Arabic interpretation, declined iPad interpreter for request for daughter to interpret.   Carrie Rivas is a 54 year old female presenting with her daughter to discuss worsening back pain and surgical referral.  Reports her left-sided low back pain that radiates down to her ankle has been present since April 2021 and worsening.  Has been evaluated for this several times, most recently in our clinic on 7/26, and has orthopedic follow-up scheduled for 8/19.  However, in the meantime she recently was visiting family in [place], [place] and due to the pain was evaluated at urgent care center.  They obtained an MRI stating the following:  At L4-L5, mild central canal stenosis, severe left lateral recess stenosis compressing the descending left L5 nerve root, moderate left neural foraminal stenosis and severe right neural foraminal stenosis.  Otherwise at L3-L4 and L5-S1 there was mild bilateral foraminal stenosis.  They were told that they should be evaluated for possible surgery and requesting this today.  She has about 10 tablets left of Norco 10/325 prescribed from urgent care, naproxen, and Flexeril to help with the pain.  Denies any overt weakness, however with so much pain feels like it does limit her.  Denies any numbness, bowel/bladder incontinence or retention, saddle anesthesia.  Walking makes it worse.  Already recommend physical therapy with minimal improvement.  PERTINENT  PMH / PSH: Chronic vision loss of right eye, left-sided back pain with radiculopathy present since April, history of hepatitis B infection now immune  OBJECTIVE:   BP 130/84    Pulse 71    Wt 162 lb 3.2 oz (73.6 kg)    SpO2 100%    BMI 24.66 kg/m   General: Alert, appears slightly uncomfortable, daughter in room HEENT: NCAT Lungs: No increased WOB  Lumbar spine: - Inspection: no gross deformity or asymmetry, swelling or  ecchymosis - Palpation: TTP with palpation over left-sided lumbar paraspinal musculature, no TTP over SI joints B/L - ROM: Limited ROM throughout F/E of lumbar spine due to pain  - Strength: 5/5 strength of lower extremity in L4-S1 nerve root distributions on the right, limited assessment of full strength on the left as she stops due to pain however at least 4/5 and has normal gait with full stride without concern - Neuro: sensation intact in the L4-S1 nerve root distribution b/l, 2+ L4  reflexes - Special testing: Negative straight leg raise  ASSESSMENT/PLAN:   Lumbar back pain with radiculopathy affecting left lower extremity 75-month history, progressive.  Recently obtained MRI lumbar spine through [facility] showing a severe left lateral recess stenosis compressing on her left L5 nerve root which is likely the culprit of her back pain.  Will refer to neurosurgery to discuss surgical intervention/spinal injection therapy, no associated weakness or neurological changes to suggest urgency or ED necessity at this time.  Rx'd gabapentin 100mg  TID and may titrate up as needed for nerve type pain, educated on common SE.  Can continue Norco, naproxen, and heat PRN.   Healthcare maintenance Should discuss cancer screening on follow-up including at least mammogram and Pap smear to start.    Unclear if it would be helpful for her to continue with her orthopedic surgery evaluation, however patient and daughter would like to proceed with this.   ED precautions discussed including development of weakness, bowel/bladder incontinence/retention, saddle anesthesia, or pain out of proportion.  , DO [facility]

## 2019-11-06 ENCOUNTER — Encounter: Payer: Self-pay | Admitting: Physician Assistant

## 2019-11-06 ENCOUNTER — Ambulatory Visit (INDEPENDENT_AMBULATORY_CARE_PROVIDER_SITE_OTHER): Payer: BC Managed Care – PPO | Admitting: Physician Assistant

## 2019-11-06 DIAGNOSIS — M48062 Spinal stenosis, lumbar region with neurogenic claudication: Secondary | ICD-10-CM

## 2019-11-06 DIAGNOSIS — M5416 Radiculopathy, lumbar region: Secondary | ICD-10-CM | POA: Diagnosis not present

## 2019-11-06 MED ORDER — HYDROCODONE-ACETAMINOPHEN 10-325 MG PO TABS: 1.0000 | ORAL_TABLET | Freq: Four times a day (QID) | ORAL | 0 refills | Status: DC

## 2019-11-06 NOTE — Progress Notes (Signed)
Office Visit Note   Patient: Carrie Rivas           Date of Birth: 02-12-1966           MRN: [id number] Visit Date: 11/06/2019              Requested by: Derrel Nip, MD [address] [place],  [place] [address] PCP: Derrel Nip, MD   Assessment & Plan: Visit Diagnoses:  1. Lumbar radicular pain   2. Spinal stenosis of lumbar region with neurogenic claudication     Plan: Given patient's findings discussed with her trying an epidural steroid injection lumbar spine versus sending her to neurosurgery for possible surgical intervention.  She would like to try a epidural steroid injection.  Like to see her back 4 weeks after the epidural steroid injection to see what type of response she had.  Questions were encouraged and answered at length today.  Follow-Up Instructions: Return 4 weeks after epidural steroid injection.   Orders:  No orders of the defined types were placed in this encounter.  Meds ordered this encounter  Medications  . HYDROcodone-acetaminophen (NORCO) 10-325 MG tablet    Sig: Take 1 tablet by mouth every 6 (six) hours.    Dispense:  30 tablet    Refill:  0      Procedures: No procedures performed   Clinical Data: No additional findings.   Subjective: Chief Complaint  Patient presents with  . Lower Back - Pain    HPI Carrie Rivas returns today due to low back pain.  She did undergo an MRI in [place].  She is continued to have low back pain and states that she only has pain that radiates down the left leg she describes numbness and pain that goes down her left leg into her left second toe.  She is on gabapentin.  She is had muscle relaxants and Medrol Dosepak.  She said no known fall or injury.  She is had some constipation that is developed over the last week.  No urinary incontinence or fecal incontinence.  She denies any saddle anesthesia like symptoms.  She states she is having difficulty sleeping due to the pain. MRI report and images  are reviewed on a CD that patient brought with her today.  MRI dated 10/17/2019 showed L3-4 mild bilateral foraminal stenosis.  L4-5 mild central canal stenosis severe left lateral recess stenosis compressing the left descending L5 nerve root.  Moderate left neuroforaminal stenosis and severe right neural stenosis.  L5-S1 foraminal stenosis is mild bilaterally. Interpreter is present to the fact patient speaks Arabic and [place] dialect.  Review of Systems  Constitutional: Negative for chills and fever.  Gastrointestinal: Positive for constipation.  Musculoskeletal: Positive for back pain.  Neurological: Positive for numbness.     Objective: Vital Signs: There were no vitals taken for this visit.  Physical Exam Constitutional:      Appearance: She is normal weight. She is not diaphoretic.  Cardiovascular:     Pulses: Normal pulses.  Pulmonary:     Effort: Pulmonary effort is normal.  Neurological:     Mental Status: She is alert.  Psychiatric:        Mood and Affect: Mood normal.     Ortho Exam Lower extremities 5 out of 5 strength throughout the lower extremities except for extension of the left lower leg against resistance.  Positive straight leg raise bilaterally.  Good range of motion bilateral hips without pain.  Sensation subjectively intact throughout  both feet. Specialty Comments:  No specialty comments available.  Imaging: No results found.   PMFS History: Patient Active Problem List   Diagnosis Date Noted  . Lumbar back pain with radiculopathy affecting left lower extremity 10/27/2019  . Increased urinary frequency 09/23/2019  . Constipation 09/23/2019  . History of hepatitis B virus infection 09/03/2019  . Left hip pain 08/21/2019  . Left knee pain 08/21/2019  . Pallor of nail bed 12/23/2018  . Seasonal allergies 12/23/2018  . Healthcare maintenance 12/23/2018  . Menopause 12/23/2018  . 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 Glaucoma 11/26/2015  . Vision loss of  right eye 11/26/2015  . Right flank pain, chronic 11/26/2015   Past Medical History:  Diagnosis Date  . Glaucoma   . Right flank pain   . Vision loss of right eye     History reviewed. No pertinent family history.  Past Surgical History:  Procedure Laterality Date  . GLAUCOMA SURGERY  In 11/2014   with subsequent loss of vision in right eye   Social History   Occupational History  . Occupation: Industrial    Comment: [employer]ile   Tobacco Use  . Smoking status: Never Smoker  . Smokeless tobacco: Never Used  Substance and Sexual Activity  . Alcohol use: No  . Drug use: No  . Sexual activity: Not Currently

## 2019-11-10 ENCOUNTER — Other Ambulatory Visit: Payer: Self-pay | Admitting: Radiology

## 2019-11-10 DIAGNOSIS — M48062 Spinal stenosis, lumbar region with neurogenic claudication: Secondary | ICD-10-CM

## 2019-11-11 ENCOUNTER — Telehealth: Payer: Self-pay | Admitting: Physical Medicine and Rehabilitation

## 2019-11-11 NOTE — Telephone Encounter (Signed)
Pt would like to be placed on a cancellation list for anything before 11/20/19;   [phone]

## 2019-11-18 ENCOUNTER — Other Ambulatory Visit: Payer: Self-pay

## 2019-11-18 ENCOUNTER — Encounter (HOSPITAL_COMMUNITY): Payer: Self-pay

## 2019-11-18 ENCOUNTER — Emergency Department (HOSPITAL_COMMUNITY)
Admission: EM | Admit: 2019-11-18 | Discharge: 2019-11-18 | Disposition: A | Discharge status: Home / Self Care | Payer: BC Managed Care – PPO | Attending: Emergency Medicine | Admitting: Emergency Medicine

## 2019-11-18 DIAGNOSIS — M5432 Sciatica, left side: Secondary | ICD-10-CM | POA: Insufficient documentation

## 2019-11-18 DIAGNOSIS — R000 Tachycardia, unspecified: Secondary | ICD-10-CM | POA: Insufficient documentation

## 2019-11-18 DIAGNOSIS — K2900 Acute gastritis without bleeding: Secondary | ICD-10-CM | POA: Insufficient documentation

## 2019-11-18 DIAGNOSIS — Z79899 Other long term (current) drug therapy: Secondary | ICD-10-CM | POA: Insufficient documentation

## 2019-11-18 DIAGNOSIS — K5900 Constipation, unspecified: Secondary | ICD-10-CM | POA: Insufficient documentation

## 2019-11-18 LAB — URINALYSIS, ROUTINE W REFLEX MICROSCOPIC
Bilirubin Urine: NEGATIVE
Glucose, UA: NEGATIVE mg/dL
Hgb urine dipstick: NEGATIVE
Ketones, ur: NEGATIVE mg/dL
Nitrite: NEGATIVE
Protein, ur: NEGATIVE mg/dL
Specific Gravity, Urine: 1.017 (ref 1.005–1.030)
pH: 5 (ref 5.0–8.0)

## 2019-11-18 LAB — COMPREHENSIVE METABOLIC PANEL
ALT: 19 U/L (ref 0–44)
AST: 21 U/L (ref 15–41)
Albumin: 3.7 g/dL (ref 3.5–5.0)
Alkaline Phosphatase: 54 U/L (ref 38–126)
Anion gap: 12 (ref 5–15)
BUN: <5 mg/dL — ABNORMAL LOW (ref 6–20)
CO2: 22 mmol/L (ref 22–32)
Calcium: 9.8 mg/dL (ref 8.9–10.3)
Chloride: 108 mmol/L (ref 98–111)
Creatinine, Ser: 0.88 mg/dL (ref 0.44–1.00)
GFR calc Af Amer: >60 mL/min (ref >60)
GFR calc non Af Amer: >60 mL/min (ref >60)
Glucose, Bld: 137 mg/dL — ABNORMAL HIGH (ref 70–99)
Potassium: 3.3 mmol/L — ABNORMAL LOW (ref 3.5–5.1)
Sodium: 142 mmol/L (ref 135–145)
Total Bilirubin: 0.5 mg/dL (ref 0.3–1.2)
Total Protein: 7.4 g/dL (ref 6.5–8.1)

## 2019-11-18 LAB — CBC
HCT: 41.2 % (ref 36.0–46.0)
Hemoglobin: 13.8 g/dL (ref 12.0–15.0)
MCH: 30.7 pg (ref 26.0–34.0)
MCHC: 33.5 g/dL (ref 30.0–36.0)
MCV: 91.8 fL (ref 80.0–100.0)
Platelets: 348 10*3/uL (ref 150–400)
RBC: 4.49 MIL/uL (ref 3.87–5.11)
RDW: 12.8 % (ref 11.5–15.5)
WBC: 4.4 10*3/uL (ref 4.0–10.5)
nRBC: 0 % (ref 0.0–0.2)

## 2019-11-18 LAB — LIPASE, BLOOD: Lipase: 52 U/L — ABNORMAL HIGH (ref 11–51)

## 2019-11-18 MED ORDER — PANTOPRAZOLE SODIUM 40 MG IV SOLR: 40.0000 mg | Freq: Once | INTRAVENOUS | Status: DC

## 2019-11-18 MED ORDER — PREDNISONE 20 MG PO TABS
60.0000 mg | ORAL_TABLET | Freq: Once | ORAL | Status: AC
  Administered 2019-11-18: 60 mg via ORAL
  Filled 2019-11-18: qty 3

## 2019-11-18 MED ORDER — OMEPRAZOLE 20 MG PO CPDR
20.0000 mg | DELAYED_RELEASE_CAPSULE | Freq: Two times a day (BID) | ORAL | 0 refills | Status: DC
Start: 2019-11-18 — End: 2020-11-29

## 2019-11-18 MED ORDER — ALUM & MAG HYDROXIDE-SIMETH 200-200-20 MG/5ML PO SUSP
30.0000 mL | Freq: Once | ORAL | Status: AC
  Administered 2019-11-18: 30 mL via ORAL
  Filled 2019-11-18: qty 30

## 2019-11-18 MED ORDER — PANTOPRAZOLE SODIUM 40 MG PO TBEC
40.0000 mg | DELAYED_RELEASE_TABLET | Freq: Once | ORAL | Status: AC
  Administered 2019-11-18: 40 mg via ORAL
  Filled 2019-11-18: qty 1

## 2019-11-18 MED ORDER — PREDNISONE 20 MG PO TABS
ORAL_TABLET | ORAL | 0 refills | Status: DC
Start: 2019-11-18 — End: 2020-01-16

## 2019-11-18 MED ORDER — DIAZEPAM 5 MG PO TABS
5.0000 mg | ORAL_TABLET | Freq: Once | ORAL | Status: AC
  Administered 2019-11-18: 5 mg via ORAL
  Filled 2019-11-18: qty 1

## 2019-11-18 MED ORDER — LIDOCAINE VISCOUS HCL 2 % MT SOLN
15.0000 mL | Freq: Once | OROMUCOSAL | Status: AC
  Administered 2019-11-18: 15 mL via ORAL
  Filled 2019-11-18: qty 15

## 2019-11-18 NOTE — Discharge Instructions (Signed)
Please take Tylenol and prednisone as prescribed for your back pain.  Avoid taking anti-inflammatory medication such as ibuprofen, Motrin, Aleve, BC powders, Goody's powders, aspirin as it may upset your stomach.  Take Protonix for the next 2 weeks.  Follow-up with your doctor for further care.  Return if you have any concern.

## 2019-11-18 NOTE — ED Provider Notes (Signed)
[facility] EMERGENCY DEPARTMENT Provider Note   CSN: [id number] Arrival date & time: 11/18/19  7425     History No chief complaint on file.   Carrie Rivas is a 54 y.o. female.  The history is provided by the patient. The history is limited by a language barrier. A language interpreter was used.     54 year old female significant history of chronic back pain presenting complaining of abdominal pain.  Patient spoke Arabic, history obtained through family member who is at bedside.  Patient has a history of low back pain with radicular pain down her right leg towards the knee.  She has been dealing with recurrent back pain for the past few months.  She normally takes hydrocodone for pain.  She no longer have any prescription for hydrocodone.  For the past several days she has been taking BC powders for her pain.  She has been taking more than prescribed dosage and throughout the days she is having pain to her epigastric region and continues to have lower back pain.  She does not complain of any nausea or vomiting.  She denies having any black or tarry stool, no bowel bladder incontinence or saddle anesthesia.  She denies any recent injury.  She describes her abdominal pain is a nonradiating burning sensation of moderate in severity but her pain is most significant to her lower back.  No dysuria.   Past Medical History:  Diagnosis Date  . Glaucoma   . Right flank pain   . Vision loss of right eye     Patient Active Problem List   Diagnosis Date Noted  . Lumbar back pain with radiculopathy affecting left lower extremity 10/27/2019  . Increased urinary frequency 09/23/2019  . Constipation 09/23/2019  . History of hepatitis B virus infection 09/03/2019  . Left hip pain 08/21/2019  . Left knee pain 08/21/2019  . Pallor of nail bed 12/23/2018  . Seasonal allergies 12/23/2018  . Healthcare maintenance 12/23/2018  . Menopause 12/23/2018  . 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  Glaucoma 11/26/2015  . 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 Right flank pain, chronic 11/26/2015    Past Surgical History:  Procedure Laterality Date  . GLAUCOMA SURGERY  In 11/2014   with subsequent loss of vision in right eye     OB History   No obstetric history on file.     No family history on file.  Social History   Tobacco Use  . Smoking status: Never Smoker  . Smokeless tobacco: Never Used  Substance Use Topics  . Alcohol use: No  . Drug use: No    Home Medications Prior to Admission medications   Medication Sig Start Date End Date Taking? Authorizing Provider  cetirizine (ZYRTEC) 10 MG tablet Take 1 tablet (10 mg total) by mouth daily. 12/23/18   Myrene Buddy, MD  cyclobenzaprine (FLEXERIL) 10 MG tablet Take 1 tablet (10 mg total) by mouth 3 (three) times daily as needed for muscle spasms. 09/08/19   Kirtland Bouchard, PA-C  dorzolamide-timolol (COSOPT) 22.3-6.8 MG/ML ophthalmic solution Place 1 drop into both eyes 2 (two) times daily. 11/26/15   Tobey Grim, MD  fluticasone (FLONASE) 50 MCG/ACT nasal spray Place 2 sprays into both nostrils daily. 01/09/18   Howard Pouch, MD  gabapentin (NEURONTIN) 100 MG capsule Take 1 capsule (100 mg total) by mouth 3 (three) times daily as needed. Can go up to maximum of 300mg  3 times daily 10/27/19   12/27/19  N, DO  HYDROcodone-acetaminophen (NORCO) 10-325 MG tablet Take 1 tablet by mouth every 6 (six) hours. 11/06/19   Kirtland Bouchard, PA-C  methylPREDNISolone (MEDROL) 4 MG tablet Take as directed 09/08/19   Kirtland Bouchard, PA-C  naproxen (NAPROSYN) 500 MG tablet Take 1 tablet (500 mg total) by mouth 2 (two) times daily with a meal. 10/06/19   Derrel Nip, MD  polyethylene glycol powder (GLYCOLAX/MIRALAX) 17 GM/SCOOP powder Take 17 g by mouth 3 (three) times daily as needed. Titrate to one stool per day 09/23/19   Derrel Nip, MD    Allergies    Patient has no known allergies.  Review of Systems   Review  of Systems  All other systems reviewed and are negative.   Physical Exam Updated Vital Signs BP (!) 143/105 (BP Location: Left Arm)   Pulse (!) 103   Temp 98.7 F (37.1 C) (Oral)   Resp 18   Ht 5\' 8"  (1.727 m)   Wt 72.6 kg   SpO2 100%   BMI 24.33 kg/m   Physical Exam Vitals and nursing note reviewed.  Constitutional:      Appearance: She is well-developed.     Comments: Patient laying in a prone position appears uncomfortable, holding her lower back.  HENT:     Head: Atraumatic.  Eyes:     Conjunctiva/sclera: Conjunctivae normal.  Cardiovascular:     Rate and Rhythm: Tachycardia present.     Pulses: Normal pulses.     Heart sounds: Normal heart sounds.  Pulmonary:     Effort: Pulmonary effort is normal.     Breath sounds: Normal breath sounds.  Abdominal:     Tenderness: There is abdominal tenderness (Mild epigastric tenderness without guarding or rebound tenderness.).  Musculoskeletal:        General: Tenderness (Tenderness to lumbar region on palpation with positive right straight leg raise.  Patellar deep tendon reflex intact bilaterally, no foot drop.) present.     Cervical back: Neck supple.  Skin:    Capillary Refill: Capillary refill takes less than 2 seconds.     Findings: No rash.  Neurological:     Mental Status: She is alert.     ED Results / Procedures / Treatments   Labs (all labs ordered are listed, but only abnormal results are displayed) Labs Reviewed  LIPASE, BLOOD - Abnormal; Notable for the following components:      Result Value   Lipase 52 (*)    All other components within normal limits  COMPREHENSIVE METABOLIC PANEL - Abnormal; Notable for the following components:   Potassium 3.3 (*)    Glucose, Bld 137 (*)    BUN <5 (*)    All other components within normal limits  URINALYSIS, ROUTINE W REFLEX MICROSCOPIC - Abnormal; Notable for the following components:   APPearance HAZY (*)    Leukocytes,Ua SMALL (*)    Bacteria, UA RARE (*)     All other components within normal limits  CBC    EKG None  Radiology No results found.  Procedures Procedures (including critical care time)  Medications Ordered in ED Medications  diazepam (VALIUM) tablet 5 mg (5 mg Oral Given 11/18/19 1031)  predniSONE (DELTASONE) tablet 60 mg (60 mg Oral Given 11/18/19 1031)  alum & mag hydroxide-simeth (MAALOX/MYLANTA) 200-200-20 MG/5ML suspension 30 mL (30 mLs Oral Given 11/18/19 1032)    And  lidocaine (XYLOCAINE) 2 % viscous mouth solution 15 mL (15 mLs Oral Given 11/18/19 1032)  pantoprazole (PROTONIX) EC  tablet 40 mg (40 mg Oral Given 11/18/19 1031)    ED Course  I have reviewed the triage vital signs and the nursing notes.  Pertinent labs & imaging results that were available during my care of the patient were reviewed by me and considered in my medical decision making (see chart for details).    MDM Rules/Calculators/A&P                          BP (!) 143/105 (BP Location: Left Arm)   Pulse (!) 103   Temp 98.7 F (37.1 C) (Oral)   Resp 18   Ht 5\' 8"  (1.727 m)   Wt 72.6 kg   SpO2 100%   BMI 24.33 kg/m   Final Clinical Impression(s) / ED Diagnoses Final diagnoses:  Acute gastritis without hemorrhage, unspecified gastritis type  Sciatica, left side    Rx / DC Orders ED Discharge Orders         Ordered    omeprazole (PRILOSEC) 20 MG capsule  2 times daily before meals        11/18/19 1117    predniSONE (DELTASONE) 20 MG tablet        11/18/19 1117         10:15 AM Patient here with radicular right leg pain likely related to her ongoing chronic back pain and sciatic manifestation.  Furthermore she also endorsed epigastric tenderness after taking multiple dose of BC powder to help with her pain.  I suspect elements of gastritis causing her pain.  I have low suspicion for gastric perforation as her abdomen is soft on exam without any peritoneal sign.  No red flags.  Will provide symptomatic treatment, monitor closely and  will determine disposition.  11:13 AM Patient was given GI cocktail as well as Protonix for gastritis secondary to NSAID use.  She was given prednisones and Valium for radicular back pain.  At this time she is resting comfortably and felt better.  Will discharge home with a course of prednisone, as well as Protonix.  She should follow-up with provider for further management of her condition.  Recommend avoid NSAID use as it may worsen her gastritis.  Return precaution given.   11/20/19, PA-C 11/18/19 1118    Cur[place], Adam, DO 11/18/19 1402

## 2019-11-18 NOTE — ED Triage Notes (Signed)
Patient complains of chronic back pain-reports taking BC powders 3 times a day and now complains of abdominal burning. Patient alert and oriented. Also complains of knee pain

## 2019-11-20 ENCOUNTER — Other Ambulatory Visit: Payer: Self-pay

## 2019-11-20 ENCOUNTER — Ambulatory Visit: Payer: Self-pay

## 2019-11-20 ENCOUNTER — Encounter: Payer: Self-pay | Admitting: Physical Medicine and Rehabilitation

## 2019-11-20 ENCOUNTER — Ambulatory Visit (INDEPENDENT_AMBULATORY_CARE_PROVIDER_SITE_OTHER): Payer: BC Managed Care – PPO | Admitting: Physical Medicine and Rehabilitation

## 2019-11-20 VITALS — BP 147/104 | HR 94

## 2019-11-20 DIAGNOSIS — M5416 Radiculopathy, lumbar region: Secondary | ICD-10-CM

## 2019-11-20 DIAGNOSIS — M48061 Spinal stenosis, lumbar region without neurogenic claudication: Secondary | ICD-10-CM | POA: Diagnosis not present

## 2019-11-20 MED ORDER — METHYLPREDNISOLONE ACETATE 80 MG/ML IJ SUSP
80.0000 mg | Freq: Once | INTRAMUSCULAR | Status: AC
  Administered 2019-11-20: 80 mg

## 2019-11-20 NOTE — Progress Notes (Signed)
Pt states left hip pain and lower, middle pain. Pt states standing for long and sitting makes the pain worse. Pt states laying on her stomach or laying on her right side helps with the pain.  Numeric Pain Rating Scale and Functional Assessment Average Pain 10   In the last MONTH (on 0-10 scale) has pain interfered with the following?  1. General activity like being  able to carry out your everyday physical activities such as walking, climbing stairs, carrying groceries, or moving a chair?  Rating(10)   +Driver, -BT, -Dye Allergies.

## 2019-11-21 ENCOUNTER — Telehealth: Payer: Self-pay | Admitting: Physician Assistant

## 2019-11-21 ENCOUNTER — Other Ambulatory Visit: Payer: Self-pay | Admitting: Physician Assistant

## 2019-11-21 MED ORDER — HYDROCODONE-ACETAMINOPHEN 10-325 MG PO TABS
1.0000 | ORAL_TABLET | Freq: Four times a day (QID) | ORAL | 0 refills | Status: DC
Start: 2019-11-21 — End: 2019-12-04

## 2019-11-21 NOTE — Telephone Encounter (Signed)
Please Advise

## 2019-11-21 NOTE — Procedures (Signed)
Lumbosacral Transforaminal Epidural Steroid Injection - Sub-Pedicular Approach with Fluoroscopic Guidance  Patient: Carrie Rivas      Date of Birth: 1965/04/22 MRN: [id number] PCP: Derrel Nip, MD      Visit Date: 11/20/2019   Universal Protocol:    Date/Time: 11/20/2019  Consent Given By: the patient  Position: PRONE  Additional Comments: Vital signs were monitored before and after the procedure. Patient was prepped and draped in the usual sterile fashion. The correct patient, procedure, and site was verified.   Injection Procedure Details:  Procedure Site One Meds Administered:  Meds ordered this encounter  Medications  . methylPREDNISolone acetate (DEPO-MEDROL) injection 80 mg    Laterality: Left  Location/Site:  L5-S1  Needle size: 22 G  Needle type: Spinal  Needle Placement: Transforaminal  Findings:    -Comments: Excellent flow of contrast along the nerve, nerve root and into the epidural space.  Procedure Details: After squaring off the end-plates to get a true AP view, the C-arm was positioned so that an oblique view of the foramen as noted above was visualized. The target area is just inferior to the "nose of the scotty dog" or sub pedicular. The soft tissues overlying this structure were infiltrated with 2-3 ml. of 1% Lidocaine without Epinephrine.  The spinal needle was inserted toward the target using a "trajectory" view along the fluoroscope beam.  Under AP and lateral visualization, the needle was advanced so it did not puncture dura and was located close the 6 O'Clock position of the pedical in AP tracterory. Biplanar projections were used to confirm position. Aspiration was confirmed to be negative for CSF and/or blood. A 1-2 ml. volume of Isovue-250 was injected and flow of contrast was noted at each level. Radiographs were obtained for documentation purposes.   After attaining the desired flow of contrast documented above, a 0.5 to 1.0 ml test  dose of 0.25% Marcaine was injected into each respective transforaminal space.  The patient was observed for 90 seconds post injection.  After no sensory deficits were reported, and normal lower extremity motor function was noted,   the above injectate was administered so that equal amounts of the injectate were placed at each foramen (level) into the transforaminal epidural space.   Additional Comments:  The patient tolerated the procedure well Dressing: 2 x 2 sterile gauze and Band-Aid    Post-procedure details: Patient was observed during the procedure. Post-procedure instructions were reviewed.  Patient left the clinic in stable condition.

## 2019-11-21 NOTE — Telephone Encounter (Signed)
Last refill 11/06/19 #30 Hydrocodone. Requesting refill on medication. S/p ESI with FN and states the pain is worse. Please advise.

## 2019-11-21 NOTE — Progress Notes (Signed)
Carrie Rivas - 54 y.o. female MRN [id number]  Date of birth: 04-22-1965  Office Visit Note: Visit Date: 11/20/2019 PCP: Derrel Nip, MD Referred by: Derrel Nip, MD  Subjective: Chief Complaint  Patient presents with  . Left Hip - Pain  . Lower Back - Pain  . Middle Back - Pain   HPI:  Carrie Rivas is a 54 y.o. female who comes in today at the request of Rexene Edison, PA-C for planned Left L5-S1 Lumbar epidural steroid injection with fluoroscopic guidance.  The patient has failed conservative care including home exercise, medications, time and activity modification.  This injection will be diagnostic and hopefully therapeutic.  Please see requesting physician notes for further details and justification.  MRI reviewed with images and spine model.  MRI reviewed in the note below.    ROS Otherwise per HPI.  Assessment & Plan: Visit Diagnoses:  1. 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   Meds & Orders:  Meds ordered this encounter  Medications  . methylPREDNISolone acetate (DEPO-MEDROL) injection 80 mg    Orders Placed This Encounter  Procedures  . XR C-ARM NO REPORT  . Epidural Steroid injection    Follow-up: Return in about 2 weeks (around 12/04/2019) for Rexene Edison, FNP.   Procedures: No procedures performed  Lumbosacral Transforaminal Epidural Steroid Injection - Sub-Pedicular Approach with Fluoroscopic Guidance  Patient: Carrie Rivas      Date of Birth: [birth year] MRN: [id number] PCP: Derrel Nip, MD      Visit Date: 11/20/2019   Universal Protocol:    Date/Time: 11/20/2019  Consent Given By: the patient  Position: PRONE  Additional Comments: Vital signs were monitored before and after the procedure. Patient was prepped and draped in the usual sterile fashion. The correct patient, procedure, and site was verified.   Injection Procedure Details:  Procedure Site  One Meds Administered:  Meds ordered this encounter  Medications  . methylPREDNISolone acetate (DEPO-MEDROL) injection 80 mg    Laterality: Left  Location/Site:  L5-S1  Needle size: 22 G  Needle type: Spinal  Needle Placement: Transforaminal  Findings:    -Comments: Excellent flow of contrast along the nerve, nerve root and into the epidural space.  Procedure Details: After squaring off the end-plates to get a true AP view, the C-arm was positioned so that an oblique view of the foramen as noted above was visualized. The target area is just inferior to the "nose of the scotty dog" or sub pedicular. The soft tissues overlying this structure were infiltrated with 2-3 ml. of 1% Lidocaine without Epinephrine.  The spinal needle was inserted toward the target using a "trajectory" view along the fluoroscope beam.  Under AP and lateral visualization, the needle was advanced so it did not puncture dura and was located close the 6 O'Clock position of the pedical in AP tracterory. Biplanar projections were used to confirm position. Aspiration was confirmed to be negative for CSF and/or blood. A 1-2 ml. volume of Isovue-250 was injected and flow of contrast was noted at each level. Radiographs were obtained for documentation purposes.   After attaining the desired flow of contrast documented above, a 0.5 to 1.0 ml test dose of 0.25% Marcaine was injected into each respective transforaminal space.  The patient was observed for 90 seconds post injection.  After no sensory deficits were reported, and normal lower extremity motor function was noted,   the above injectate was  administered so that equal amounts of the injectate were placed at each foramen (level) into the transforaminal epidural space.   Additional Comments:  The patient tolerated the procedure well Dressing: 2 x 2 sterile gauze and Band-Aid    Post-procedure details: Patient was observed during the procedure. Post-procedure  instructions were reviewed.  Patient left the clinic in stable condition.      Clinical History: No specialty comments available.     Objective:  VS:  HT:    WT:   BMI:     BP:(!) 147/104  HR:94bpm  TEMP: ( )  RESP:  Physical Exam Constitutional:      General: She is not in acute distress.    Appearance: Normal appearance. She is not ill-appearing.  HENT:     Head: Normocephalic and atraumatic.     Right Ear: External ear normal.     Left Ear: External ear normal.  Eyes:     Extraocular Movements: Extraocular movements intact.  Cardiovascular:     Rate and Rhythm: Normal rate.     Pulses: Normal pulses.  Musculoskeletal:     Right lower leg: No edema.     Left lower leg: No edema.     Comments: Patient has good distal strength with no pain over the greater trochanters.  No clonus or focal weakness.  Skin:    Findings: No erythema, lesion or rash.  Neurological:     General: No focal deficit present.     Mental Status: She is alert and oriented to person, place, and time.     Sensory: No sensory deficit.     Motor: No weakness or abnormal muscle tone.     Coordination: Coordination normal.  Psychiatric:        Mood and Affect: Mood normal.        Behavior: Behavior normal.      Imaging: XR C-ARM NO REPORT  Result Date: 11/20/2019 Please see Notes tab for imaging impression.

## 2019-11-21 NOTE — Telephone Encounter (Signed)
Sent in

## 2019-11-21 NOTE — Telephone Encounter (Signed)
I called and sw pt's son to advise that has been faxed to pharm.

## 2019-11-21 NOTE — Telephone Encounter (Signed)
I would defer to [place] at this point but if not available then let me know

## 2019-11-21 NOTE — Telephone Encounter (Signed)
Patient;s son Aier called advised patient need Rx refilled (Hydrocodone)  Patient is out of her medication.   Patient said she had an injection yesterday by Dr Alvester Morin and the pain is worse per patient's son Aier.  The number to contact Aier is [phone]

## 2019-12-04 ENCOUNTER — Other Ambulatory Visit: Payer: Self-pay | Admitting: Physician Assistant

## 2019-12-04 ENCOUNTER — Telehealth: Payer: Self-pay | Admitting: Physician Assistant

## 2019-12-04 MED ORDER — HYDROCODONE-ACETAMINOPHEN 10-325 MG PO TABS
1.0000 | ORAL_TABLET | Freq: Four times a day (QID) | ORAL | 0 refills | Status: DC
Start: 2019-12-04 — End: 2019-12-11

## 2019-12-04 NOTE — Telephone Encounter (Signed)
Sent in

## 2019-12-04 NOTE — Telephone Encounter (Signed)
Patient's daughter called advised patient need Rx refilled Hydrocodone   The number to contact Adaw is [phone]

## 2019-12-04 NOTE — Telephone Encounter (Signed)
Please advise 

## 2019-12-05 NOTE — Telephone Encounter (Signed)
Tried claling to advise done.  No answer.

## 2019-12-11 ENCOUNTER — Ambulatory Visit (INDEPENDENT_AMBULATORY_CARE_PROVIDER_SITE_OTHER): Payer: BC Managed Care – PPO | Admitting: Physician Assistant

## 2019-12-11 ENCOUNTER — Encounter: Payer: Self-pay | Admitting: Physician Assistant

## 2019-12-11 DIAGNOSIS — M5416 Radiculopathy, lumbar region: Secondary | ICD-10-CM

## 2019-12-11 DIAGNOSIS — M48062 Spinal stenosis, lumbar region with neurogenic claudication: Secondary | ICD-10-CM

## 2019-12-11 MED ORDER — HYDROCODONE-ACETAMINOPHEN 10-325 MG PO TABS
1.0000 | ORAL_TABLET | Freq: Four times a day (QID) | ORAL | 0 refills | Status: DC
Start: 2019-12-11 — End: 2019-12-19

## 2019-12-11 NOTE — Progress Notes (Signed)
HPI: Carrie Rivas returns today follow-up status post left lumbar L4-5 S1 transforaminal injection with Dr. Alvester Rivas.  She states it was helpful for 1 week.  States now that her pain has returned and is worse than it was prior to the injection.  She states she is having difficulty transitioning from sitting to standing position.  Pain awakens her.  She is having no saddle anesthesia like symptoms.  She continues to have radicular symptoms down the left leg.  She describes as knifelike stabbing pain burning to the left ankle.  She notes that she has developed some stress incontinence with sneezing that she did not have prior to the injection with Dr. Alvester Rivas.  She presents with an interpreter today.  Review of systems: Please see HPI otherwise negative or noncontributory.  Physical exam: General well-developed well-nourished female no acute distress mood and affect appropriate. Bilateral lower extremity she has 5 out of 5 strength throughout lower extremities against resistance.  Positive straight leg raise on the left.   Impression: Lumbar radiculopathy left leg   Plan: Due to the fact the patient's failed conservative treatment which is consisted of exercises for her back, medications and a transforaminal injection at L5-S1 on the left recommend referral with neurosurgery.  We will keep her out of work until she can be seen by neurosurgery.  Questions were encouraged and answered at length today.  Given interpreter was used to speak with his patient due to the fact that she speaks Arabic with a [place] dialect. Refill on her hydrocodone was given today.

## 2019-12-12 ENCOUNTER — Other Ambulatory Visit: Payer: Self-pay | Admitting: Radiology

## 2019-12-12 DIAGNOSIS — M5416 Radiculopathy, lumbar region: Secondary | ICD-10-CM

## 2019-12-19 ENCOUNTER — Other Ambulatory Visit: Payer: Self-pay | Admitting: Orthopaedic Surgery

## 2019-12-19 ENCOUNTER — Telehealth: Payer: Self-pay | Admitting: Physician Assistant

## 2019-12-19 MED ORDER — HYDROCODONE-ACETAMINOPHEN 10-325 MG PO TABS: 1.0000 | ORAL_TABLET | Freq: Four times a day (QID) | ORAL | 0 refills | Status: DC

## 2019-12-19 NOTE — Telephone Encounter (Signed)
We can provide it one more time only.  Let them know that we can't keep her on this. Thanks

## 2019-12-19 NOTE — Telephone Encounter (Signed)
Please advise 

## 2019-12-19 NOTE — Telephone Encounter (Signed)
Patient's daughter Adaw called advised patient need Rx refilled (Hydrocodone) The number to contact Adaw is [phone]

## 2019-12-22 NOTE — Telephone Encounter (Signed)
LMOM of the below message  

## 2020-01-05 ENCOUNTER — Telehealth: Payer: Self-pay | Admitting: Physician Assistant

## 2020-01-05 NOTE — Telephone Encounter (Signed)
Please advise 

## 2020-01-05 NOTE — Telephone Encounter (Signed)
Pt called stating she needs a refill of her hydrocodone sent in and a CB to let her know when it's been called in.   [phone]

## 2020-01-06 ENCOUNTER — Telehealth: Payer: Self-pay | Admitting: Orthopaedic Surgery

## 2020-01-06 NOTE — Telephone Encounter (Signed)
Patient's daughter called about update on mother's hydrocodone refill and surgery date. Please send medication to pharmacy on file and call concerning a surgery date. Phone number is [phone].

## 2020-01-06 NOTE — Telephone Encounter (Signed)
Should get from Neurosurgery  as they are seeing here now.

## 2020-01-06 NOTE — Telephone Encounter (Signed)
Patient was seen 10/7 by Dr. Maurice Small. They are refaxing ov note

## 2020-01-06 NOTE — Telephone Encounter (Signed)
I called patient's daughter and advised she has been seen by neurosurgeon and that is who they need to call to get medication refilled and surgery date.

## 2020-01-06 NOTE — Telephone Encounter (Signed)
Can you advise on note?

## 2020-01-06 NOTE — Telephone Encounter (Signed)
I called patient's daughter and advised she has been seen by neurosurgeon and that is who they need to call to get medication refilled and surgery date. 

## 2020-01-06 NOTE — Telephone Encounter (Signed)
Saw note from neurosurgery yesterday stating she needed surgery. Ask Tammy if that note got scanned in anywhere.

## 2020-01-06 NOTE — Telephone Encounter (Signed)
I spoke with patient's daughter. They are still awaiting appt with neurosurgery and have not seen anyone yet. Can you fill until patient is seen there?

## 2020-01-13 ENCOUNTER — Other Ambulatory Visit: Payer: Self-pay | Admitting: Neurological Surgery

## 2020-01-16 ENCOUNTER — Other Ambulatory Visit (HOSPITAL_COMMUNITY)
Admission: RE | Admit: 2020-01-16 | Discharge: 2020-01-16 | Disposition: A | Discharge status: Home / Self Care | Payer: BC Managed Care – PPO | Source: Ambulatory Visit | Attending: Neurological Surgery | Admitting: Neurological Surgery

## 2020-01-16 DIAGNOSIS — Z01812 Encounter for preprocedural laboratory examination: Secondary | ICD-10-CM | POA: Insufficient documentation

## 2020-01-16 DIAGNOSIS — Z20822 Contact with and (suspected) exposure to covid-19: Secondary | ICD-10-CM | POA: Insufficient documentation

## 2020-01-17 LAB — SARS CORONAVIRUS 2 (TAT 6-24 HRS): SARS Coronavirus 2: NEGATIVE

## 2020-01-20 ENCOUNTER — Ambulatory Visit (HOSPITAL_COMMUNITY): Payer: BC Managed Care – PPO | Admitting: Certified Registered"

## 2020-01-20 ENCOUNTER — Encounter (HOSPITAL_COMMUNITY)
Admission: RE | Disposition: A | Discharge status: Home / Self Care | Payer: Self-pay | Source: Home / Self Care | Attending: Neurological Surgery

## 2020-01-20 ENCOUNTER — Ambulatory Visit (HOSPITAL_COMMUNITY): Payer: BC Managed Care – PPO

## 2020-01-20 ENCOUNTER — Observation Stay (HOSPITAL_COMMUNITY)
Admission: RE | Admit: 2020-01-20 | Discharge: 2020-01-21 | Disposition: A | Discharge status: Home / Self Care | Payer: BC Managed Care – PPO | Attending: Neurological Surgery | Admitting: Neurological Surgery

## 2020-01-20 ENCOUNTER — Encounter (HOSPITAL_COMMUNITY): Payer: Self-pay | Admitting: Neurological Surgery

## 2020-01-20 ENCOUNTER — Other Ambulatory Visit: Payer: Self-pay

## 2020-01-20 DIAGNOSIS — M5116 Intervertebral disc disorders with radiculopathy, lumbar region: Secondary | ICD-10-CM | POA: Diagnosis not present

## 2020-01-20 DIAGNOSIS — Z419 Encounter for procedure for purposes other than remedying health state, unspecified: Secondary | ICD-10-CM

## 2020-01-20 DIAGNOSIS — M5416 Radiculopathy, lumbar region: Secondary | ICD-10-CM

## 2020-01-20 DIAGNOSIS — M549 Dorsalgia, unspecified: Secondary | ICD-10-CM | POA: Diagnosis present

## 2020-01-20 HISTORY — PX: LUMBAR LAMINECTOMY/ DECOMPRESSION WITH MET-RX: SHX5959

## 2020-01-20 LAB — CBC
HCT: 38.2 % (ref 36.0–46.0)
Hemoglobin: 12.5 g/dL (ref 12.0–15.0)
MCH: 30.7 pg (ref 26.0–34.0)
MCHC: 32.7 g/dL (ref 30.0–36.0)
MCV: 93.9 fL (ref 80.0–100.0)
Platelets: 272 10*3/uL (ref 150–400)
RBC: 4.07 MIL/uL (ref 3.87–5.11)
RDW: 13 % (ref 11.5–15.5)
WBC: 5.4 10*3/uL (ref 4.0–10.5)
nRBC: 0 % (ref 0.0–0.2)

## 2020-01-20 LAB — SURGICAL PCR SCREEN
MRSA, PCR: NEGATIVE
Staphylococcus aureus: NEGATIVE

## 2020-01-20 IMAGING — RF DG LUMBAR SPINE 1V
1 series · 1 of 1 positions shown · non-contrast
Comparison: Lumbar spine MRI [DATE]. lumbar spine radiographs
[DATE].

CLINICAL DATA: Surgery elective. Additional history provided: Left
lumbar 4-5 minimally invasive micro discectomy. Reported fluoroscopy
time 7 seconds (3.42 mGy).

EXAM:
DG C-ARM 1-60 MIN; LUMBAR SPINE - 1 VIEW

[Series 1: run · 1 of 1 slices shown]
[im 1/1]
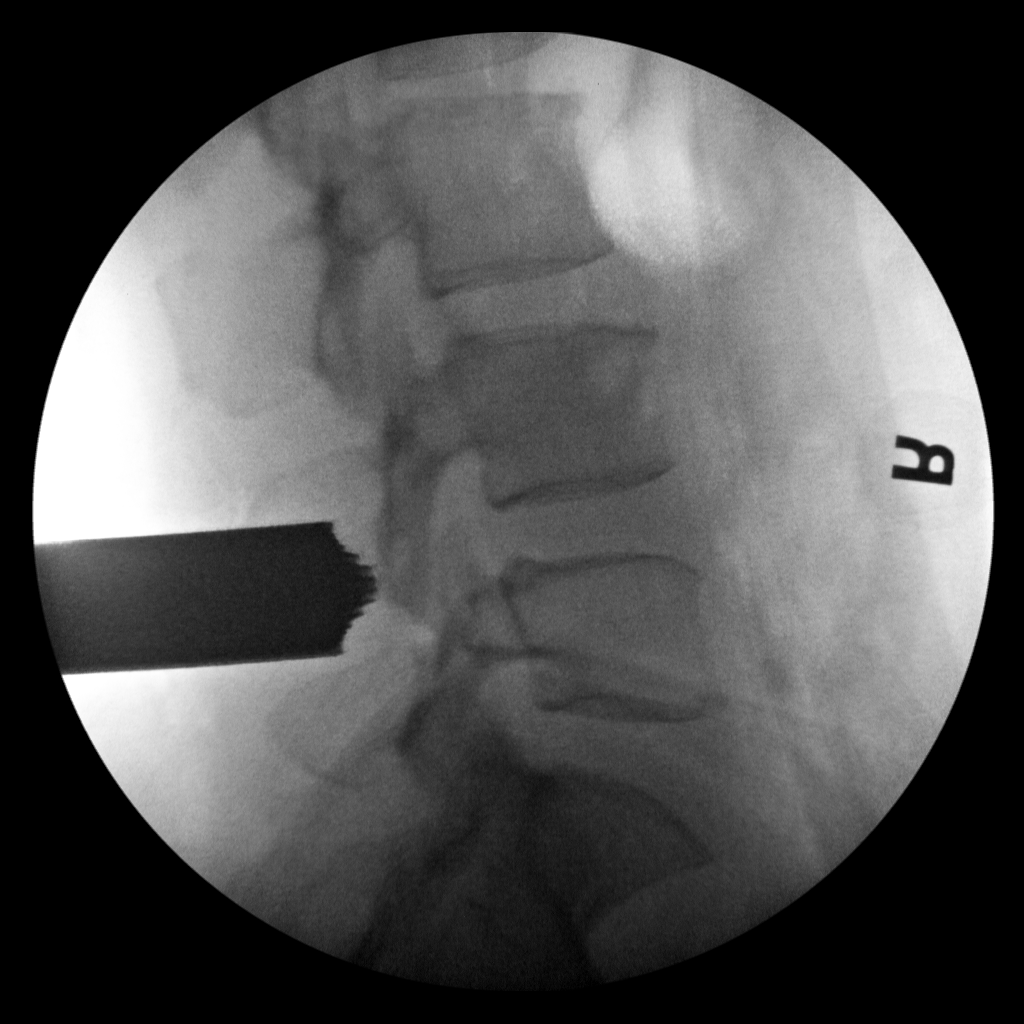

[1 of 1 positions shown; findings below may reference images not displayed]

FINDINGS: A single lateral view intraoperative fluoroscopic image of the
lumbar spine is submitted. There are 5 lumbar vertebrae and the
caudal most well-formed intervertebral disc space is designated
L5-S1 (correlating with lumbar spine radiographs performed
[DATE]). The provided image demonstrates a metallic site marker
projecting posterior to the L4-L5 interspace.
IMPRESSION: Single lateral view intraoperative fluoroscopic image of the lumbar
spine with a metallic site marker projecting posterior to the L4-L5
interspace.

## 2020-01-20 IMAGING — RF DG C-ARM 1-60 MIN
1 series · 1 of 1 positions shown · non-contrast
Comparison: Lumbar spine MRI [DATE]. lumbar spine radiographs
[DATE].

CLINICAL DATA: Surgery elective. Additional history provided: Left
lumbar 4-5 minimally invasive micro discectomy. Reported fluoroscopy
time 7 seconds (3.42 mGy).

EXAM:
DG C-ARM 1-60 MIN; LUMBAR SPINE - 1 VIEW

[Series 1: run · 1 of 1 slices shown]
[im 1/1]
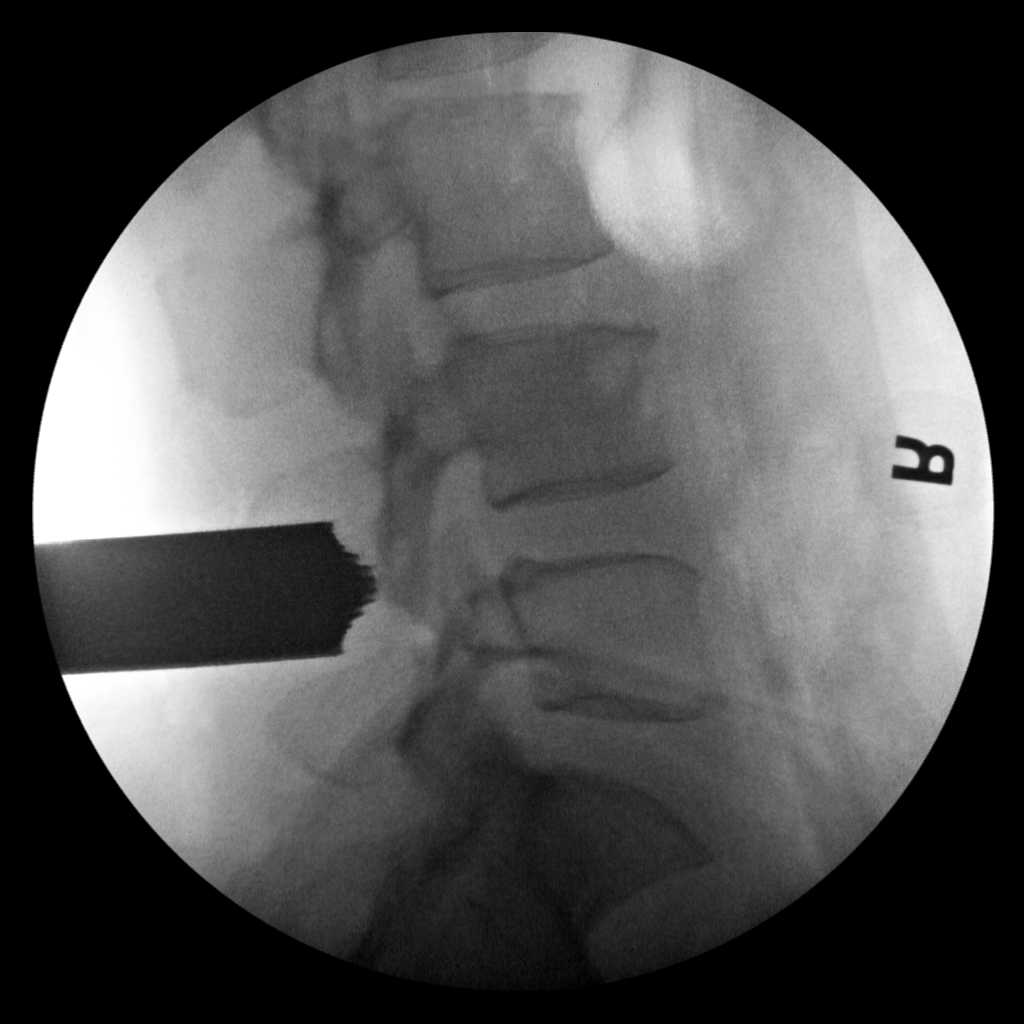

[1 of 1 positions shown; findings below may reference images not displayed]

FINDINGS: A single lateral view intraoperative fluoroscopic image of the
lumbar spine is submitted. There are 5 lumbar vertebrae and the
caudal most well-formed intervertebral disc space is designated
L5-S1 (correlating with lumbar spine radiographs performed
[DATE]). The provided image demonstrates a metallic site marker
projecting posterior to the L4-L5 interspace.
IMPRESSION: Single lateral view intraoperative fluoroscopic image of the lumbar
spine with a metallic site marker projecting posterior to the L4-L5
interspace.

## 2020-01-20 SURGERY — LUMBAR LAMINECTOMY/ DECOMPRESSION WITH MET-RX
Anesthesia: General | Site: Spine Lumbar | Laterality: Left

## 2020-01-20 MED ORDER — HYDROMORPHONE HCL 1 MG/ML IJ SOLN
0.2500 mg | INTRAMUSCULAR | Status: DC | PRN
  Administered 2020-01-20 (×3): 0.5 mg via INTRAVENOUS

## 2020-01-20 MED ORDER — PHENYLEPHRINE 40 MCG/ML (10ML) SYRINGE FOR IV PUSH (FOR BLOOD PRESSURE SUPPORT)
PREFILLED_SYRINGE | INTRAVENOUS | Status: DC | PRN
  Administered 2020-01-20: 80 ug via INTRAVENOUS

## 2020-01-20 MED ORDER — PROPOFOL 10 MG/ML IV BOLUS
INTRAVENOUS | Status: DC | PRN
  Administered 2020-01-20: 150 mg via INTRAVENOUS

## 2020-01-20 MED ORDER — CHLORHEXIDINE GLUCONATE CLOTH 2 % EX PADS: 6.0000 | MEDICATED_PAD | Freq: Once | CUTANEOUS | Status: DC

## 2020-01-20 MED ORDER — PHENOL 1.4 % MT LIQD: 1.0000 | OROMUCOSAL | Status: DC | PRN

## 2020-01-20 MED ORDER — PROMETHAZINE HCL 25 MG/ML IJ SOLN: 6.2500 mg | INTRAMUSCULAR | Status: DC | PRN

## 2020-01-20 MED ORDER — ONDANSETRON HCL 4 MG/2ML IJ SOLN: 4.0000 mg | Freq: Four times a day (QID) | INTRAMUSCULAR | Status: DC | PRN

## 2020-01-20 MED ORDER — LIDOCAINE 2% (20 MG/ML) 5 ML SYRINGE
INTRAMUSCULAR | Status: AC
  Filled 2020-01-20: qty 5

## 2020-01-20 MED ORDER — ACETAMINOPHEN 10 MG/ML IV SOLN: 1000.0000 mg | Freq: Once | INTRAVENOUS | Status: DC | PRN

## 2020-01-20 MED ORDER — OXYCODONE HCL 5 MG PO TABS: 5.0000 mg | ORAL_TABLET | ORAL | Status: DC | PRN

## 2020-01-20 MED ORDER — THROMBIN 5000 UNITS EX SOLR
OROMUCOSAL | Status: DC | PRN
  Administered 2020-01-20: 5 mL via TOPICAL

## 2020-01-20 MED ORDER — DEXAMETHASONE SODIUM PHOSPHATE 10 MG/ML IJ SOLN
INTRAMUSCULAR | Status: DC | PRN
  Administered 2020-01-20: 5 mg via INTRAVENOUS

## 2020-01-20 MED ORDER — CEFAZOLIN SODIUM-DEXTROSE 2-4 GM/100ML-% IV SOLN
2.0000 g | Freq: Three times a day (TID) | INTRAVENOUS | Status: AC
  Administered 2020-01-20 – 2020-01-21 (×2): 2 g via INTRAVENOUS
  Filled 2020-01-20 (×2): qty 100

## 2020-01-20 MED ORDER — LIDOCAINE-EPINEPHRINE 1 %-1:100000 IJ SOLN
INTRAMUSCULAR | Status: AC
  Filled 2020-01-20: qty 1

## 2020-01-20 MED ORDER — ACETAMINOPHEN 650 MG RE SUPP: 650.0000 mg | RECTAL | Status: DC | PRN

## 2020-01-20 MED ORDER — LIDOCAINE-EPINEPHRINE 1 %-1:100000 IJ SOLN
INTRAMUSCULAR | Status: DC | PRN
  Administered 2020-01-20: 10 mL

## 2020-01-20 MED ORDER — MENTHOL 3 MG MT LOZG: 1.0000 | LOZENGE | OROMUCOSAL | Status: DC | PRN

## 2020-01-20 MED ORDER — SODIUM CHLORIDE 0.9 % IV SOLN: 250.0000 mL | INTRAVENOUS | Status: DC

## 2020-01-20 MED ORDER — 0.9 % SODIUM CHLORIDE (POUR BTL) OPTIME
TOPICAL | Status: DC | PRN
  Administered 2020-01-20: 1000 mL

## 2020-01-20 MED ORDER — HYDROMORPHONE HCL 1 MG/ML IJ SOLN
INTRAMUSCULAR | Status: AC
  Filled 2020-01-20: qty 1

## 2020-01-20 MED ORDER — SODIUM CHLORIDE 0.9% FLUSH
3.0000 mL | Freq: Two times a day (BID) | INTRAVENOUS | Status: DC
  Administered 2020-01-20: 3 mL via INTRAVENOUS

## 2020-01-20 MED ORDER — FENTANYL CITRATE (PF) 250 MCG/5ML IJ SOLN
INTRAMUSCULAR | Status: DC | PRN
  Administered 2020-01-20 (×5): 50 ug via INTRAVENOUS

## 2020-01-20 MED ORDER — LACTATED RINGERS IV SOLN: INTRAVENOUS | Status: DC

## 2020-01-20 MED ORDER — MIDAZOLAM HCL 2 MG/2ML IJ SOLN
INTRAMUSCULAR | Status: AC
  Filled 2020-01-20: qty 2

## 2020-01-20 MED ORDER — PHENYLEPHRINE 40 MCG/ML (10ML) SYRINGE FOR IV PUSH (FOR BLOOD PRESSURE SUPPORT)
PREFILLED_SYRINGE | INTRAVENOUS | Status: AC
  Filled 2020-01-20: qty 10

## 2020-01-20 MED ORDER — SUGAMMADEX SODIUM 200 MG/2ML IV SOLN
INTRAVENOUS | Status: DC | PRN
  Administered 2020-01-20: 200 mg via INTRAVENOUS

## 2020-01-20 MED ORDER — CEFAZOLIN SODIUM-DEXTROSE 2-4 GM/100ML-% IV SOLN
2.0000 g | INTRAVENOUS | Status: AC
  Administered 2020-01-20: 2 g via INTRAVENOUS
  Filled 2020-01-20: qty 100

## 2020-01-20 MED ORDER — CYCLOBENZAPRINE HCL 10 MG PO TABS
10.0000 mg | ORAL_TABLET | Freq: Three times a day (TID) | ORAL | Status: DC | PRN
  Administered 2020-01-20: 10 mg via ORAL
  Filled 2020-01-20: qty 1

## 2020-01-20 MED ORDER — SODIUM CHLORIDE 0.9% FLUSH: 3.0000 mL | INTRAVENOUS | Status: DC | PRN

## 2020-01-20 MED ORDER — ONDANSETRON HCL 4 MG PO TABS: 4.0000 mg | ORAL_TABLET | Freq: Four times a day (QID) | ORAL | Status: DC | PRN

## 2020-01-20 MED ORDER — ROCURONIUM BROMIDE 10 MG/ML (PF) SYRINGE
PREFILLED_SYRINGE | INTRAVENOUS | Status: AC
  Filled 2020-01-20: qty 10

## 2020-01-20 MED ORDER — ONDANSETRON HCL 4 MG/2ML IJ SOLN
INTRAMUSCULAR | Status: DC | PRN
  Administered 2020-01-20: 4 mg via INTRAVENOUS

## 2020-01-20 MED ORDER — HYDROMORPHONE HCL 1 MG/ML IJ SOLN: 1.0000 mg | INTRAMUSCULAR | Status: DC | PRN

## 2020-01-20 MED ORDER — MIDAZOLAM HCL 5 MG/5ML IJ SOLN
INTRAMUSCULAR | Status: DC | PRN
  Administered 2020-01-20: 2 mg via INTRAVENOUS

## 2020-01-20 MED ORDER — CHLORHEXIDINE GLUCONATE 0.12 % MT SOLN
15.0000 mL | Freq: Once | OROMUCOSAL | Status: AC
  Administered 2020-01-20: 15 mL via OROMUCOSAL
  Filled 2020-01-20: qty 15

## 2020-01-20 MED ORDER — LIDOCAINE 2% (20 MG/ML) 5 ML SYRINGE
INTRAMUSCULAR | Status: DC | PRN
  Administered 2020-01-20: 100 mg via INTRAVENOUS

## 2020-01-20 MED ORDER — THROMBIN 5000 UNITS EX SOLR
CUTANEOUS | Status: AC
  Filled 2020-01-20: qty 5000

## 2020-01-20 MED ORDER — PROPOFOL 10 MG/ML IV BOLUS
INTRAVENOUS | Status: AC
  Filled 2020-01-20: qty 20

## 2020-01-20 MED ORDER — POLYETHYLENE GLYCOL 3350 17 G PO PACK: 17.0000 g | PACK | Freq: Every day | ORAL | Status: DC | PRN

## 2020-01-20 MED ORDER — ORAL CARE MOUTH RINSE: 15.0000 mL | Freq: Once | OROMUCOSAL | Status: AC

## 2020-01-20 MED ORDER — DOCUSATE SODIUM 100 MG PO CAPS
100.0000 mg | ORAL_CAPSULE | Freq: Two times a day (BID) | ORAL | Status: DC
  Administered 2020-01-20: 100 mg via ORAL
  Filled 2020-01-20: qty 1

## 2020-01-20 MED ORDER — FENTANYL CITRATE (PF) 250 MCG/5ML IJ SOLN
INTRAMUSCULAR | Status: AC
  Filled 2020-01-20: qty 5

## 2020-01-20 MED ORDER — OXYCODONE HCL 5 MG PO TABS
10.0000 mg | ORAL_TABLET | ORAL | Status: DC | PRN
  Administered 2020-01-20 (×2): 10 mg via ORAL
  Filled 2020-01-20 (×2): qty 2

## 2020-01-20 MED ORDER — ROCURONIUM BROMIDE 10 MG/ML (PF) SYRINGE
PREFILLED_SYRINGE | INTRAVENOUS | Status: DC | PRN
  Administered 2020-01-20: 60 mg via INTRAVENOUS
  Administered 2020-01-20: 20 mg via INTRAVENOUS

## 2020-01-20 MED ORDER — ACETAMINOPHEN 325 MG PO TABS: 650.0000 mg | ORAL_TABLET | ORAL | Status: DC | PRN

## 2020-01-20 SURGICAL SUPPLY — 54 items
BAND RUBBER #18 3X1/16 STRL (MISCELLANEOUS) ×6 IMPLANT
BLADE CLIPPER SURG (BLADE) IMPLANT
BLADE SURG 11 STRL SS (BLADE) ×3 IMPLANT
BUR PRECISION FLUTE 5.0 (BURR) IMPLANT
BUR PRECISION MATCH 3.0 13 (BURR) ×2 IMPLANT
BUR PRECISION MATCH 3.0 13CM (BURR) ×1
CANISTER SUCT 3000ML PPV (MISCELLANEOUS) ×3 IMPLANT
COVER WAND RF STERILE (DRAPES) IMPLANT
DECANTER SPIKE VIAL GLASS SM (MISCELLANEOUS) ×3 IMPLANT
DERMABOND ADVANCED (GAUZE/BANDAGES/DRESSINGS) ×2
DERMABOND ADVANCED .7 DNX12 (GAUZE/BANDAGES/DRESSINGS) ×1 IMPLANT
DRAPE C-ARM 42X72 X-RAY (DRAPES) ×6 IMPLANT
DRAPE LAPAROTOMY 100X72X124 (DRAPES) ×3 IMPLANT
DRAPE MICROSCOPE LEICA (MISCELLANEOUS) ×3 IMPLANT
DRAPE SURG 17X23 STRL (DRAPES) IMPLANT
DURAPREP 26ML APPLICATOR (WOUND CARE) ×3 IMPLANT
ELECT BLADE 6.5 EXT (BLADE) ×3 IMPLANT
ELECT REM PT RETURN 9FT ADLT (ELECTROSURGICAL) ×3
ELECTRODE REM PT RTRN 9FT ADLT (ELECTROSURGICAL) ×1 IMPLANT
GAUZE 4X4 16PLY RFD (DISPOSABLE) IMPLANT
GAUZE SPONGE 4X4 12PLY STRL (GAUZE/BANDAGES/DRESSINGS) IMPLANT
GLOVE BIO SURGEON STRL SZ 6.5 (GLOVE) IMPLANT
GLOVE BIO SURGEON STRL SZ7.5 (GLOVE) ×6 IMPLANT
GLOVE BIO SURGEONS STRL SZ 6.5 (GLOVE)
GLOVE BIOGEL PI IND STRL 6.5 (GLOVE) IMPLANT
GLOVE BIOGEL PI IND STRL 7.5 (GLOVE) ×2 IMPLANT
GLOVE BIOGEL PI INDICATOR 6.5 (GLOVE)
GLOVE BIOGEL PI INDICATOR 7.5 (GLOVE) ×4
GLOVE EXAM NITRILE LRG STRL (GLOVE) IMPLANT
GLOVE EXAM NITRILE XL STR (GLOVE) IMPLANT
GLOVE EXAM NITRILE XS STR PU (GLOVE) IMPLANT
GLOVE SURG SS PI 7.0 STRL IVOR (GLOVE) ×6 IMPLANT
GOWN STRL REUS W/ TWL LRG LVL3 (GOWN DISPOSABLE) ×2 IMPLANT
GOWN STRL REUS W/ TWL XL LVL3 (GOWN DISPOSABLE) IMPLANT
GOWN STRL REUS W/TWL 2XL LVL3 (GOWN DISPOSABLE) IMPLANT
GOWN STRL REUS W/TWL LRG LVL3 (GOWN DISPOSABLE) ×4
GOWN STRL REUS W/TWL XL LVL3 (GOWN DISPOSABLE)
HEMOSTAT POWDER KIT SURGIFOAM (HEMOSTASIS) ×3 IMPLANT
KIT BASIN OR (CUSTOM PROCEDURE TRAY) ×3 IMPLANT
KIT TURNOVER KIT B (KITS) ×3 IMPLANT
NEEDLE HYPO 22GX1.5 SAFETY (NEEDLE) ×3 IMPLANT
NEEDLE SPNL 18GX3.5 QUINCKE PK (NEEDLE) ×3 IMPLANT
NS IRRIG 1000ML POUR BTL (IV SOLUTION) ×3 IMPLANT
PACK LAMINECTOMY NEURO (CUSTOM PROCEDURE TRAY) ×3 IMPLANT
PAD ARMBOARD 7.5X6 YLW CONV (MISCELLANEOUS) ×15 IMPLANT
SPONGE LAP 4X18 RFD (DISPOSABLE) IMPLANT
SUT MNCRL AB 3-0 PS2 18 (SUTURE) ×3 IMPLANT
SUT VIC AB 0 CT1 18XCR BRD8 (SUTURE) ×1 IMPLANT
SUT VIC AB 0 CT1 8-18 (SUTURE) ×2
SUT VIC AB 2-0 CP2 18 (SUTURE) ×3 IMPLANT
SYR BULB IRRIG 60ML STRL (SYRINGE) ×3 IMPLANT
TOWEL GREEN STERILE (TOWEL DISPOSABLE) ×3 IMPLANT
TOWEL GREEN STERILE FF (TOWEL DISPOSABLE) ×3 IMPLANT
WATER STERILE IRR 1000ML POUR (IV SOLUTION) ×3 IMPLANT

## 2020-01-20 NOTE — Anesthesia Preprocedure Evaluation (Signed)
Anesthesia Evaluation  Patient identified by MRN, date of birth, ID band Patient awake    Reviewed: Allergy & Precautions, NPO status , Patient's Chart, lab work & pertinent test results  Airway Mallampati: II  TM Distance: >3 FB Neck ROM: Full    Dental no notable dental hx.    Pulmonary neg pulmonary ROS,    Pulmonary exam normal breath sounds clear to auscultation       Cardiovascular negative cardio ROS Normal cardiovascular exam Rhythm:Regular Rate:Normal     Neuro/Psych negative neurological ROS  negative psych ROS   GI/Hepatic negative GI ROS, Neg liver ROS,   Endo/Other  negative endocrine ROS  Renal/GU negative Renal ROS  negative genitourinary   Musculoskeletal negative musculoskeletal ROS (+)   Abdominal   Peds negative pediatric ROS (+)  Hematology negative hematology ROS (+)   Anesthesia Other Findings   Reproductive/Obstetrics negative OB ROS                             Anesthesia Physical Anesthesia Plan  ASA: II  Anesthesia Plan: General   Post-op Pain Management:    Induction: Intravenous  PONV Risk Score and Plan: 3 and Ondansetron, Dexamethasone, Midazolam and Treatment may vary due to age or medical condition  Airway Management Planned: Oral ETT  Additional Equipment:   Intra-op Plan:   Post-operative Plan: Extubation in OR  Informed Consent: I have reviewed the patients History and Physical, chart, labs and discussed the procedure including the risks, benefits and alternatives for the proposed anesthesia with the patient or authorized representative who has indicated his/her understanding and acceptance.   Dental advisory given  Plan Discussed with: CRNA and Surgeon  Anesthesia Plan Comments:         Anesthesia Quick Evaluation  

## 2020-01-20 NOTE — Anesthesia Procedure Notes (Signed)
Procedure Name: Intubation Date/Time: 01/20/2020 9:05 AM Performed by: Amadeo Garnet, CRNA Pre-anesthesia Checklist: Patient identified, Emergency Drugs available, Suction available and Patient being monitored Patient Re-evaluated:Patient Re-evaluated prior to induction Oxygen Delivery Method: Circle system utilized Preoxygenation: Pre-oxygenation with 100% oxygen Induction Type: IV induction Ventilation: Mask ventilation without difficulty Laryngoscope Size: Mac and 4 Grade View: Grade I Tube type: Oral Tube size: 7.0 mm Number of attempts: 1 Airway Equipment and Method: Stylet and Oral airway Placement Confirmation: ETT inserted through vocal cords under direct vision,  positive ETCO2 and breath sounds checked- equal and bilateral Secured at: 22 cm Tube secured with: Tape Dental Injury: Teeth and Oropharynx as per pre-operative assessment

## 2020-01-20 NOTE — Transfer of Care (Signed)
Immediate Anesthesia Transfer of Care Note  Patient: Carrie Rivas  Procedure(s) Performed: Left Lumbar Four-Five Minimally invasive microdiscectomy (Left Spine Lumbar)  Patient Location: PACU  Anesthesia Type:General  Level of Consciousness: awake, alert  and oriented  Airway & Oxygen Therapy: Patient Spontanous Breathing and Patient connected to face mask oxygen  Post-op Assessment: Report given to RN, Post -op Vital signs reviewed and stable and Patient moving all extremities  Post vital signs: Reviewed and stable  Last Vitals:  Vitals Value Taken Time  BP 125/82 01/20/20 1039  Temp    Pulse 96 01/20/20 1040  Resp 12 01/20/20 1040  SpO2 100 % 01/20/20 1040  Vitals shown include unvalidated device data.  Last Pain:  Vitals:   01/20/20 0814  TempSrc:   PainSc: 10-Worst pain ever      Patients Stated Pain Goal: 0 (01/20/20 9892)  Complications: No complications documented.

## 2020-01-20 NOTE — Anesthesia Postprocedure Evaluation (Signed)
Anesthesia Post Note  Patient: Carrie Rivas  Procedure(s) Performed: Left Lumbar Four-Five Minimally invasive microdiscectomy (Left Spine Lumbar)     Patient location during evaluation: PACU Anesthesia Type: General Level of consciousness: awake and alert Pain management: pain level controlled Vital Signs Assessment: post-procedure vital signs reviewed and stable Respiratory status: spontaneous breathing, nonlabored ventilation, respiratory function stable and patient connected to nasal cannula oxygen Cardiovascular status: blood pressure returned to baseline and stable Postop Assessment: no apparent nausea or vomiting Anesthetic complications: no   No complications documented.  Last Vitals:  Vitals:   01/20/20 1130 01/20/20 1145  BP: (!) 129/94 (!) 126/94  Pulse: 72 68  Resp: 12 13  Temp:    SpO2: 100% 99%    Last Pain:  Vitals:   01/20/20 1115  TempSrc:   PainSc: 8                  Lanique Gonzalo S

## 2020-01-20 NOTE — Evaluation (Signed)
Physical Therapy Evaluation Patient Details Name: Carrie Rivas MRN: [id number] DOB: Jan 24, 1966 Today's Date: 01/20/2020   History of Present Illness  pt is a 54 y/o female with h/o glaucoma and right flank pain, admitted for lumbar surgery for HNP with radiculopathy, s/p L45 discectomy.  Clinical Impression  Pt is close to baseline functioning and should be safe at home with available assist. There are no further acute PT needs.  Will sign off at this time.     Follow Up Recommendations No PT follow up    Equipment Recommendations  None recommended by PT    Recommendations for Other Services       Precautions / Restrictions Precautions Precautions: Back Restrictions Weight Bearing Restrictions: No      Mobility  Bed Mobility Overal bed mobility: Needs Assistance Bed Mobility: Rolling;Sidelying to Sit;Sit to Sidelying Rolling: Min guard Sidelying to sit: Min guard     Sit to sidelying: Min guard General bed mobility comments: reinforced safe technique    Transfers Overall transfer level: Needs assistance   Transfers: Sit to/from Stand Sit to Stand: Min guard            Ambulation/Gait Ambulation/Gait assistance: Min guard Gait Distance (Feet): 280 Feet Assistive device: None Gait Pattern/deviations: Step-through pattern   Gait velocity interpretation: <1.8 ft/sec, indicate of risk for recurrent falls General Gait Details: mildly unsteady, but safe without AD  Stairs Stairs: Yes Stairs assistance: Min guard Stair Management: One rail Right;Alternating pattern;Forwards;Step to pattern Number of Stairs: 4 General stair comments: safe with the rail  Wheelchair Mobility    Modified Rankin (Stroke Patients Only)       Balance Overall balance assessment: Mild deficits observed, not formally tested                                           Pertinent Vitals/Pain Pain Assessment: Faces Faces Pain Scale: Hurts little more Pain  Location: incisional Pain Descriptors / Indicators: Discomfort Pain Intervention(s): Monitored during session    Home Living Family/patient expects to be discharged to:: Private residence Living Arrangements: Children Available Help at Discharge: Family;Available 24 hours/day;Available PRN/intermittently Type of Home: Apartment Home Access: Level entry     Home Layout: One level Home Equipment: None      Prior Function Level of Independence: Independent         Comments: has been able to work lately due to back radicualar pain     Hand Dominance        Extremity/Trunk Assessment   Upper Extremity Assessment Upper Extremity Assessment: Overall WFL for tasks assessed    Lower Extremity Assessment Lower Extremity Assessment: Overall WFL for tasks assessed       Communication   Communication: No difficulties;Prefers language other than English  Cognition Arousal/Alertness: Awake/alert Behavior During Therapy: WFL for tasks assessed/performed Overall Cognitive Status: Within Functional Limits for tasks assessed                                        General Comments General comments (skin integrity, edema, etc.): Instructed/Advised in back care/prec, log roll, lifting restrictions and progression of activity.    Exercises     Assessment/Plan    PT Assessment Patent does not need any further PT services  PT Problem List  PT Treatment Interventions      PT Goals (Current goals can be found in the Care Plan section)  Acute Rehab PT Goals Patient Stated Goal: get back to work when able PT Goal Formulation: All assessment and education complete, DC therapy    Frequency     Barriers to discharge        Co-evaluation               AM-PAC PT "6 Clicks" Mobility  Outcome Measure Help needed turning from your back to your side while in a flat bed without using bedrails?: A Little Help needed moving from lying on your back to  sitting on the side of a flat bed without using bedrails?: A Little Help needed moving to and from a bed to a chair (including a wheelchair)?: A Little Help needed standing up from a chair using your arms (e.g., wheelchair or bedside chair)?: A Little Help needed to walk in hospital room?: A Little Help needed climbing 3-5 steps with a railing? : A Little 6 Click Score: 18    End of Session   Activity Tolerance: Patient tolerated treatment well Patient left: in bed;with call bell/phone within reach;with family/visitor present Nurse Communication: Mobility status PT Visit Diagnosis: Other abnormalities of gait and mobility (R26.89);Pain Pain - part of body:  (incision/back)    Time: 0160-1093 PT Time Calculation (min) (ACUTE ONLY): 25 min   Charges:   PT Evaluation $PT Eval Low Complexity: 1 Low PT Treatments $Gait Training: 8-22 mins        01/20/2020  Jacinto Halim., PT Acute Rehabilitation Services [phone]  (pager) [phone]  (office)  Eliseo Gum Doranne Schmutz 01/20/2020, 4:50 PM

## 2020-01-20 NOTE — Op Note (Signed)
PATIENT: Carrie Rivas  DAY OF SURGERY: 01/20/20   PRE-OPERATIVE DIAGNOSIS:  Herniated nucleus pulposus with lumbar radiculopathy   POST-OPERATIVE DIAGNOSIS:  Herniated nucleus pulposus with lumbar radiculopathy   PROCEDURE:  Left minimally invasive L4-5 discectomy   SURGEON:  Surgeon(s) and Role:    Jadene Pierini, MD - Primary   ANESTHESIA: ETGA   BRIEF HISTORY: This is a 54 year old woman who presented with severe low back and LLE radicular pain in an L5 distribution, MRI showed a corresponding disc herniation and and nerve root compression. I therefore recommended a minimally invasive L4-5 discectomy. This was discussed with the patient as well as risks, benefits, and alternatives and the patient wished to proceed with surgical treatment.    OPERATIVE DETAIL: The patient was taken to the operating room and placed on the OR table in the prone position. A formal time out was performed with two patient identifiers and confirmed the operative site. Anesthesia was induced by the anesthesia team. The operative site was marked, hair was clipped with surgical clippers, the area was then prepped and draped in a sterile fashion. Fluoroscopy was used to identify the surgical level prior to incision.   A 2cm incision was then marked 1cm off to the left of midline. The fascia was incised sharply and serial dilators were docked to the lamino-facet junction using fluoroscopy to confirm position as well as perform a second count to confirm the correct surgical level. After a final dilator was placed, a tubular retractor was placed over this and secured to the table. The operating microscope was draped and brought into the field. Anatomy was palpated and confirmed, monopolar cautery was used to expose the facet, lamina, and a portion of the spinous process to confirm orientation. A high speed drill and kerrison rongeurs were then used to create a hemilaminotomy and small medial facetectomy. The  ligamentum flavum was resected and the thecal sac and traversing nerve root were identified. The traversing nerve root was gently retracted medially to expose the disc space using a suction-retractor. A large disc herniation was clearly present that appeared to be largely due to a free fragment. An annulotomy was created sharply and large, free disc fragments that were easily removed. The annulotomy was explored and additional free fragments were removed. The traversing nerve root was palpated throughout its visible course to confirm good decompression. The medial aspect of the neuro-foramen was also probed with a right angle ball-tip probe to confirm decompression of the exiting nerve root.   Hemostasis was obtained, the wound was copiously irrigated, and the tube was removed while using the microscope to confirm hemostasis of the muscle edges. All instrument and sponge counts were correct and the incision was then closed in layers. The patient was then returned to anesthesia for emergence. No apparent complications at the completion of the procedure.    EBL:  64mL   DRAINS: none   SPECIMENS: none   Jadene Pierini, MD 01/20/20 10:29 AM

## 2020-01-20 NOTE — Discharge Instructions (Signed)
Discharge Instructions ° °No restriction in activities, slowly increase your activity back to normal.  ° °Your incision is closed with dermabond (purple glue). This will naturally fall off over the next 1-2 weeks.  ° °Okay to shower on the day of discharge. Use regular soap and water and try to be gentle when cleaning your incision.  ° °Follow up with Dr. Annalese Stiner in 2 weeks after discharge. If you do not already have a discharge appointment, please call his office at [phone] to schedule a follow up appointment. If you have any concerns or questions, please call the office and let us know. °

## 2020-01-20 NOTE — H&P (Signed)
Surgical H&P Update  HPI: 54 y.o. woman with a 6 month history of pain travelign from the back into the left buttock and into the left foot. Her symptoms did not respond to non-surgical treatments, she is now here for a left L4-5 MIS discectomy. No changes in health since she was last seen. Still having pain and numbness and wishes to proceed with surgery.  PMHx:  Past Medical History:  Diagnosis Date  . Glaucoma   . Right flank pain   . Vision loss of right eye    FamHx: No family history on file. SocHx:  reports that she has never smoked. She has never used smokeless tobacco. She reports that she does not drink alcohol and does not use drugs.  Physical Exam: AOx3, PERRL, FS, TM  Strength 5/5 x4, SILTx4 except left L5/S1 distribution numbness  Assesment/Plan: 54 y.o. woman with left lower extremity radiculopathy, here for left L4-5 MIS discectomy. Risks, benefits, and alternatives discussed and the patient would like to continue with surgery.  -OR today -likely home from PACU post-op vs 3C if she needs to stay overnight  Jadene Pierini, MD 01/20/20 7:31 AM

## 2020-01-21 ENCOUNTER — Encounter (HOSPITAL_COMMUNITY): Payer: Self-pay | Admitting: Neurological Surgery

## 2020-01-21 DIAGNOSIS — M5116 Intervertebral disc disorders with radiculopathy, lumbar region: Secondary | ICD-10-CM | POA: Diagnosis not present

## 2020-01-21 MED ORDER — HYDROCODONE-ACETAMINOPHEN 5-325 MG PO TABS: 1.0000 | ORAL_TABLET | Freq: Four times a day (QID) | ORAL | 0 refills | Status: DC | PRN

## 2020-01-21 MED ORDER — CYCLOBENZAPRINE HCL 10 MG PO TABS: 10.0000 mg | ORAL_TABLET | Freq: Three times a day (TID) | ORAL | 0 refills | Status: DC | PRN

## 2020-01-21 NOTE — Progress Notes (Signed)
Occupational Therapy Evaluation Patient Details Name: Carrie Rivas MRN: [id number] DOB: [birth year] Today's Date: 01/21/2020    History of Present Illness pt is a 54 y/o female with h/o glaucoma and right flank pain, admitted for lumbar surgery for HNP with radiculopathy, s/p L45 discectomy.   Clinical Impression   PTA patient was independent with BADLs/IADLs including working full-time. Daughter present at bedside for translation. OT provided patient/family education on safety with self-care tasks while adhering to back precautions. Patient and daughter expressed verbal understanding. Patient currently functioning near baseline level of function demonstrating ADL transfers, grooming standing at sink level, toileitng, and bed mobility with Mod I. Patient does not required continued acute occupational therapy services with OT to sign off at this time. Recommendation for intermittent assist from family upon d/c as needed.     Follow Up Recommendations  No OT follow up;Supervision - Intermittent    Equipment Recommendations  None recommended by OT    Recommendations for Other Services       Precautions / Restrictions Precautions Precautions: Back Precaution Booklet Issued: Yes (comment) Precaution Comments: Patient able to recall 3/3 back precautions Restrictions Weight Bearing Restrictions: No      Mobility Bed Mobility Overal bed mobility: Needs Assistance Bed Mobility: Rolling;Sidelying to Sit;Sit to Sidelying Rolling: Modified independent (Device/Increase time) Sidelying to sit: Modified independent (Device/Increase time)     Sit to sidelying: Modified independent (Device/Increase time) General bed mobility comments: Demonstrates log rolling technique without cueing    Transfers Overall transfer level: Modified independent   Transfers: Sit to/from Stand Sit to Stand: Modified independent (Device/Increase time)              Balance Overall balance assessment:  Mild deficits observed, not formally tested                                         ADL either performed or assessed with clinical judgement   ADL Overall ADL's : At baseline                                             Vision Baseline Vision/History: No visual deficits Patient Visual Report: No change from baseline Vision Assessment?: No apparent visual deficits     Perception     Praxis      Pertinent Vitals/Pain Pain Assessment: 0-10 Pain Score: 3  Pain Location: incisional Pain Descriptors / Indicators: Discomfort Pain Intervention(s): Limited activity within patient's tolerance;Monitored during session     Hand Dominance Right   Extremity/Trunk Assessment Upper Extremity Assessment Upper Extremity Assessment: Overall WFL for tasks assessed   Lower Extremity Assessment Lower Extremity Assessment: Overall WFL for tasks assessed       Communication Communication Communication: No difficulties;Prefers language other than English   Cognition Arousal/Alertness: Awake/alert Behavior During Therapy: WFL for tasks assessed/performed Overall Cognitive Status: Within Functional Limits for tasks assessed                                     General Comments       Exercises     Shoulder Instructions      Home Living Family/patient expects to be discharged to:: Private residence Living Arrangements: Children Available Help  at Discharge: Family;Available 24 hours/day;Available PRN/intermittently Type of Home: Apartment Home Access: Level entry     Home Layout: One level     Bathroom Shower/Tub: Chief Strategy Officer: Standard     Home Equipment: None          Prior Functioning/Environment Level of Independence: Independent        Comments: has been able to work lately due to back radicualar pain        OT Problem List: Pain      OT Treatment/Interventions:      OT Goals(Current  goals can be found in the care plan section) Acute Rehab OT Goals Patient Stated Goal: get back to work when able OT Goal Formulation: With patient  OT Frequency:     Barriers to D/C:            Co-evaluation              AM-PAC OT "6 Clicks" Daily Activity     Outcome Measure Help from another person eating meals?: None Help from another person taking care of personal grooming?: None Help from another person toileting, which includes using toliet, bedpan, or urinal?: None Help from another person bathing (including washing, rinsing, drying)?: A Little Help from another person to put on and taking off regular upper body clothing?: None Help from another person to put on and taking off regular lower body clothing?: None 6 Click Score: 23   End of Session Equipment Utilized During Treatment: Gait belt  Activity Tolerance: Patient tolerated treatment well Patient left: in bed;with call bell/phone within reach;with family/visitor present  OT Visit Diagnosis: Unsteadiness on feet (R26.81);Pain Pain - part of body:  (Back)                Time: 2202-5427 OT Time Calculation (min): 13 min Charges:  OT Evaluation $OT Eval Low Complexity: 1 Low  Danniell Rotundo H. OTR/L Supplemental OT, Department of rehab services [phone]  Alynah Schone R H. 01/21/2020, 10:41 AM

## 2020-01-21 NOTE — Discharge Summary (Signed)
Physician Discharge Summary  Patient ID: Carrie Rivas MRN: [id number] DOB/AGE: 1965/11/03 54 y.o.  Admit date: 01/20/2020 Discharge date: 01/21/2020  Admission Diagnoses:Herniated nucleus pulposus with lumbar radiculopathy  Discharge Diagnoses:  Active Problems:   Lumbar radiculopathy Herniated nucleus pulposus with lumbar radiculopathy  Discharged Condition: good  Hospital Course: The patient was admitted on 01/20/2020 and taken to the operating room for left minimally invasive L4-5 disectomy. The patient tolerated the procedure well. The patient taken to the recovery room and then to the floor in stable condition. The hospital course was routine. There was no complications. The wound remains clean, dry and intact. The patient remained afebrile with stable vital signs, and tolerated a regular diet. Pt has been ambulating with PT with no difficulty. Pt states back lower back pain and LLE radicular pain have improved this. We will send her home with Hydrocodone for pain control. Pt is scheduled to follow up in clinic with Dr. Maurice Small in 2 weeks. Wound care instructions and discharge instructions has been provided, all questions/concerns answered.  Treatments: Left minimally invasive L4-5 discectomy  Discharge Exam: Blood pressure 99/68, pulse 88, temperature 98.5 F (36.9 C), temperature source Oral, resp. rate 18, height 5\' 8"  (1.727 m), weight 72.6 kg, last menstrual period 10/19/2018, SpO2 100 %.   AOx3, PERRL, FC  Strength 5/5 x4, SILTx4 except left L5/S1 distribution numbness Incision c/d/l  Disposition: Discharge disposition: 01-Home or Self Care      Discharge Instructions    Discharge patient   Complete by: As directed    Discharge disposition: 01-Home or Self Care   Discharge patient date: 01/20/2020     Allergies as of 01/21/2020   No Known Allergies     Medication List    STOP taking these medications   acetaminophen 325 MG tablet Commonly known as:  TYLENOL   naproxen 500 MG tablet Commonly known as: Naprosyn     TAKE these medications   cetirizine 10 MG tablet Commonly known as: ZYRTEC Take 1 tablet (10 mg total) by mouth daily.   cyclobenzaprine 10 MG tablet Commonly known as: FLEXERIL Take 1 tablet (10 mg total) by mouth 3 (three) times daily as needed for muscle spasms.   dorzolamide-timolol 22.3-6.8 MG/ML ophthalmic solution Commonly known as: Cosopt Place 1 drop into both eyes 2 (two) times daily.   fluticasone 50 MCG/ACT nasal spray Commonly known as: FLONASE Place 2 sprays into both nostrils daily.   gabapentin 100 MG capsule Commonly known as: NEURONTIN Take 1 capsule (100 mg total) by mouth 3 (three) times daily as needed. Can go up to maximum of 300mg  3 times daily   HYDROcodone-acetaminophen 5-325 MG tablet Commonly known as: Norco Take 1 tablet by mouth every 6 (six) hours as needed for moderate pain. What changed: reasons to take this   omeprazole 20 MG capsule Commonly known as: PRILOSEC Take 1 capsule (20 mg total) by mouth 2 (two) times daily before a meal.   polyethylene glycol powder 17 GM/SCOOP powder Commonly known as: GLYCOLAX/MIRALAX Take 17 g by mouth 3 (three) times daily as needed. Titrate to one stool per day        Signed: 13/05/2019 01/21/2020, 9:06 AM

## 2020-01-21 NOTE — Plan of Care (Signed)
Pt doing well. Pt and daughter given D/C instructions with verbal understanding. Rx's were sent to the pharmacy by MD. Pt's incision is clean and dry with no sign of infection. Pt's IV was removed prior to D/C. Pt D/C'd home via wheelchair. Pt is stable @ D/C and has no other needs at this time. Rema Fendt, RN

## 2020-01-21 NOTE — Progress Notes (Signed)
Neurosurgery Service Progress Note  Subjective: No acute events overnight, preop radicular pain resolved   Objective: Vitals:   01/20/20 2016 01/20/20 2314 01/21/20 0323 01/21/20 0753  BP: 126/88 100/74 106/60 99/68  Pulse: 91 78 83 88  Resp: 18 18 18 18   Temp: 98.1 F (36.7 C) 98.3 F (36.8 C) 98.1 F (36.7 C) 98.5 F (36.9 C)  TempSrc: Oral Oral Oral Oral  SpO2: 100% 100% 100% 100%  Weight:      Height:       Temp (24hrs), Avg:98.1 F (36.7 C), Min:97.7 F (36.5 C), Max:98.5 F (36.9 C)  CBC Latest Ref Rng & Units 01/20/2020 11/18/2019 09/02/2019  WBC 4.0 - 10.5 K/uL 5.4 4.4 3.3(L)  Hemoglobin 12.0 - 15.0 g/dL 09/04/2019 20.2 54.2  Hematocrit 36 - 46 % 38.2 41.2 41.9  Platelets 150 - 400 K/uL 272 348 240   BMP Latest Ref Rng & Units 11/18/2019 09/23/2019 09/02/2019  Glucose 70 - 99 mg/dL 09/04/2019) 81 94  BUN 6 - 20 mg/dL 237(S) 9 13  Creatinine 0.44 - 1.00 mg/dL <2(G 3.15 1.76  BUN/Creat Ratio 9 - 23 - 14 20  Sodium 135 - 145 mmol/L 142 143 141  Potassium 3.5 - 5.1 mmol/L 3.3(L) 4.2 4.3  Chloride 98 - 111 mmol/L 108 107(H) 105  CO2 22 - 32 mmol/L 22 22 25   Calcium 8.9 - 10.3 mg/dL 9.8 9.7 9.5    Intake/Output Summary (Last 24 hours) at 01/21/2020 0809 Last data filed at 01/20/2020 1020 Gross per 24 hour  Intake 1100 ml  Output --  Net 1100 ml    Current Facility-Administered Medications:    0.9 %  sodium chloride infusion, 250 mL, Intravenous, Continuous, Crystie Yanko, 13/05/2019, MD   acetaminophen (TYLENOL) tablet 650 mg, 650 mg, Oral, Q4H PRN **OR** acetaminophen (TYLENOL) suppository 650 mg, 650 mg, Rectal, Q4H PRN, 13/04/2019, MD   cyclobenzaprine (FLEXERIL) tablet 10 mg, 10 mg, Oral, TID PRN, Clovis Pu, MD, 10 mg at 01/20/20 2045   docusate sodium (COLACE) capsule 100 mg, 100 mg, Oral, BID, 13/02/21, MD, 100 mg at 01/20/20 2046   HYDROmorphone (DILAUDID) injection 1 mg, 1 mg, Intravenous, Q3H PRN, 13/02/21, MD    menthol-cetylpyridinium (CEPACOL) lozenge 3 mg, 1 lozenge, Oral, PRN **OR** phenol (CHLORASEPTIC) mouth spray 1 spray, 1 spray, Mouth/Throat, PRN, Jacolyn Joaquin, 2047, MD   ondansetron (ZOFRAN) tablet 4 mg, 4 mg, Oral, Q6H PRN **OR** ondansetron (ZOFRAN) injection 4 mg, 4 mg, Intravenous, Q6H PRN, Jadene Pierini, MD   oxyCODONE (Oxy IR/ROXICODONE) immediate release tablet 10 mg, 10 mg, Oral, Q4H PRN, Clovis Pu, MD, 10 mg at 01/20/20 2045   oxyCODONE (Oxy IR/ROXICODONE) immediate release tablet 5 mg, 5 mg, Oral, Q4H PRN, 13/02/21, MD   polyethylene glycol (MIRALAX / GLYCOLAX) packet 17 g, 17 g, Oral, Daily PRN, Zeph Riebel A, MD   sodium chloride flush (NS) 0.9 % injection 3 mL, 3 mL, Intravenous, Q12H, Akeisha Lagerquist A, MD, 3 mL at 01/20/20 2053   sodium chloride flush (NS) 0.9 % injection 3 mL, 3 mL, Intravenous, PRN, Shareta Fishbaugh, 13/02/21, MD   Physical Exam: AOx3, PERRL, EOMI, FS, Strength 5/5 x4, SILTx4  Assessment & Plan: 54 y.o. woman s/p L L4-5 MIS discectomy, recovering well.  -discharge home today -PT rec no PT f/u  Clovis Pu  01/21/20 8:09 AM

## 2020-04-01 ENCOUNTER — Other Ambulatory Visit (HOSPITAL_COMMUNITY): Payer: Self-pay | Admitting: Neurological Surgery

## 2020-04-01 ENCOUNTER — Other Ambulatory Visit: Payer: Self-pay | Admitting: Neurological Surgery

## 2020-04-01 DIAGNOSIS — M5416 Radiculopathy, lumbar region: Secondary | ICD-10-CM

## 2020-04-13 ENCOUNTER — Ambulatory Visit (HOSPITAL_COMMUNITY): Admission: RE | Admit: 2020-04-13 | Payer: Self-pay | Source: Ambulatory Visit

## 2020-04-21 ENCOUNTER — Ambulatory Visit (HOSPITAL_COMMUNITY): Admission: RE | Admit: 2020-04-21 | Payer: Self-pay | Source: Ambulatory Visit

## 2020-04-21 ENCOUNTER — Encounter (HOSPITAL_COMMUNITY): Payer: Self-pay

## 2020-05-07 ENCOUNTER — Ambulatory Visit (INDEPENDENT_AMBULATORY_CARE_PROVIDER_SITE_OTHER): Payer: Medicaid Other | Admitting: Family Medicine

## 2020-05-07 ENCOUNTER — Other Ambulatory Visit: Payer: Self-pay

## 2020-05-07 ENCOUNTER — Encounter: Payer: Self-pay | Admitting: Family Medicine

## 2020-05-07 ENCOUNTER — Ambulatory Visit (INDEPENDENT_AMBULATORY_CARE_PROVIDER_SITE_OTHER): Payer: Medicaid Other

## 2020-05-07 VITALS — BP 132/84 | HR 83 | Ht 68.0 in | Wt 171.0 lb

## 2020-05-07 DIAGNOSIS — Z1231 Encounter for screening mammogram for malignant neoplasm of breast: Secondary | ICD-10-CM

## 2020-05-07 DIAGNOSIS — Z23 Encounter for immunization: Secondary | ICD-10-CM | POA: Diagnosis present

## 2020-05-07 NOTE — Progress Notes (Signed)
    SUBJECTIVE:   CHIEF COMPLAINT / HPI:   Initial COVID-19 vaccination Patient presents for initial COVID-19 vaccine.  She reports that she had COVID in the start of 2020.  She is not received any of the vaccines.  All questions were answered regarding the vaccination.  She would also like to receive the flu vaccine today.   OBJECTIVE:   BP 132/84   Pulse 83   Ht 5\' 8"  (1.727 m)   Wt 171 lb (77.6 kg)   LMP 10/19/2018 (Approximate)   SpO2 99%   BMI 26.00 kg/m   General: Well-appearing 55 year old female, no acute distress Cardiac: Regular rate and rhythm, no murmurs appreciated Respiratory: Normal breathing, lungs clear to auscultation bilaterally MSK: Ambulates without difficulty  ASSESSMENT/PLAN:   COVID-19 vaccination Discussed the risks and benefits of receiving the COVID-19 vaccine.  Patient is agreeable to receive the vaccine today and will schedule a visit with nurse clinic in 3 weeks for the second dose.  She also would like to receive the flu vaccine today.  It was given.   40, MD [facility]

## 2020-05-07 NOTE — Patient Instructions (Signed)
It was a pleasure seeing you today.  You received the first dose of the COVID-19 vaccine.  You will need to return in 3 weeks to receive the second dose.  This can be a nurse visit so you should schedule it when you leave.  You also need to schedule a Pap smear visit.  If you have any questions or concerns please feel free to call the clinic.  I hope you have a wonderful afternoon!

## 2020-05-17 ENCOUNTER — Ambulatory Visit (INDEPENDENT_AMBULATORY_CARE_PROVIDER_SITE_OTHER): Payer: Medicaid Other | Admitting: Family Medicine

## 2020-05-17 ENCOUNTER — Other Ambulatory Visit (HOSPITAL_COMMUNITY)
Admission: RE | Admit: 2020-05-17 | Discharge: 2020-05-17 | Disposition: A | Discharge status: Home / Self Care | Payer: Medicaid Other | Source: Ambulatory Visit | Attending: Family Medicine | Admitting: Family Medicine

## 2020-05-17 ENCOUNTER — Other Ambulatory Visit: Payer: Self-pay

## 2020-05-17 DIAGNOSIS — Z124 Encounter for screening for malignant neoplasm of cervix: Secondary | ICD-10-CM | POA: Insufficient documentation

## 2020-05-17 MED ORDER — FLUTICASONE PROPIONATE 50 MCG/ACT NA SUSP
2.0000 | Freq: Every day | NASAL | 6 refills | Status: DC
Start: 2020-05-17 — End: 2021-04-01

## 2020-05-17 NOTE — Progress Notes (Signed)
    SUBJECTIVE:   CHIEF COMPLAINT / HPI:   Pap smear-a translator was used for this part of the evaluation. Patient reports only for a Pap smear.  She has no concerns she is just not ever had a Pap smear.  Denies any abnormal uterine bleeding.  Denies any abdominal pelvic pressure pain.  Denies any abnormal vaginal discharge.  No further questions or concerns.   OBJECTIVE:   LMP 10/19/2018 (Approximate)   General: Well-appearing 55 year old female, no acute distress Cardiac: Regular rate and rhythm Abdomen: Soft, nontender, positive bowel sounds GU: (CMA present for this exam) normal external vaginal tissue, no erythema or lesions noted.  Normal internal vaginal tissue with no excess mucus, discharge.  Cervix nonirritated, nonfriable, nonerythematous.  ASSESSMENT/PLAN:   Routine cervical smear Routine Pap smear completed today.  Pending results if everything is normal she will need repeat in 5 years.  All questions or concerns answered.     Derrel Nip, MD [facility]

## 2020-05-17 NOTE — Patient Instructions (Signed)
It was wonderful seeing you today.  We completed a Pap smear and those results will go to your MyChart.  There are instructions to start a MyChart on these discharge instructions.  We have also scheduled you for the second dose of your COVID-19 vaccine.  I also reordered nasal spray to help with your allergies.  If you have any questions or concerns please feel free to call our clinic.  Happy of wonderful afternoon

## 2020-05-18 DIAGNOSIS — Z124 Encounter for screening for malignant neoplasm of cervix: Secondary | ICD-10-CM | POA: Insufficient documentation

## 2020-05-18 NOTE — Assessment & Plan Note (Signed)
Routine Pap smear completed today.  Pending results if everything is normal she will need repeat in 5 years.  All questions or concerns answered.

## 2020-05-19 LAB — CYTOLOGY - PAP
Comment: NEGATIVE
Diagnosis: NEGATIVE
High risk HPV: NEGATIVE

## 2020-05-28 ENCOUNTER — Ambulatory Visit: Payer: Medicaid Other

## 2020-06-03 ENCOUNTER — Ambulatory Visit (INDEPENDENT_AMBULATORY_CARE_PROVIDER_SITE_OTHER): Payer: Medicaid Other

## 2020-06-03 ENCOUNTER — Ambulatory Visit (INDEPENDENT_AMBULATORY_CARE_PROVIDER_SITE_OTHER): Payer: Medicaid Other | Admitting: *Deleted

## 2020-06-03 ENCOUNTER — Other Ambulatory Visit: Payer: Self-pay

## 2020-06-03 DIAGNOSIS — Z23 Encounter for immunization: Secondary | ICD-10-CM

## 2020-06-03 NOTE — Progress Notes (Signed)
2nd Pfizer given.  Tolerated well.  Waited 15 minutes with no issues. Teleshia Lemere, CMA  

## 2020-07-09 ENCOUNTER — Other Ambulatory Visit: Payer: Self-pay

## 2020-07-09 ENCOUNTER — Ambulatory Visit (HOSPITAL_COMMUNITY)
Admission: RE | Admit: 2020-07-09 | Discharge: 2020-07-09 | Disposition: A | Discharge status: Home / Self Care | Payer: Medicaid Other | Source: Ambulatory Visit | Attending: Neurological Surgery | Admitting: Neurological Surgery

## 2020-07-09 DIAGNOSIS — M5416 Radiculopathy, lumbar region: Secondary | ICD-10-CM | POA: Insufficient documentation

## 2020-07-09 IMAGING — MR MR LUMBAR SPINE W/O CM
5 series · 31 of 48 positions shown · non-contrast
Comparison: [DATE]

CLINICAL DATA: Back pain when sitting or standing too long.

EXAM:
MRI LUMBAR SPINE WITHOUT CONTRAST
TECHNIQUE: Multiplanar, multisequence MR imaging of the lumbar spine was
performed. No intravenous contrast was administered.

[Series 5: T1 · sagittal · 4.0mm · 0.81mm/px · 6 of 15 slices shown (1 of 2)]
[im 1/15]
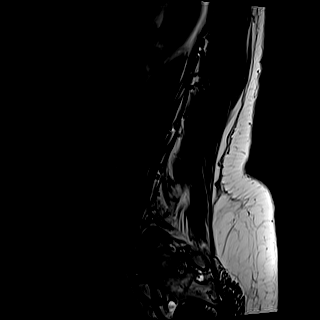
[im 3/15]
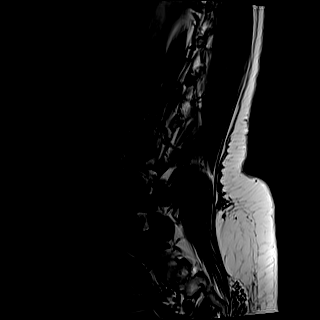
[im 6/15]
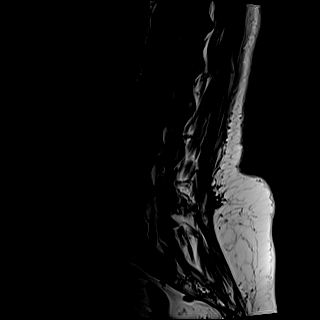
[im 9/15]
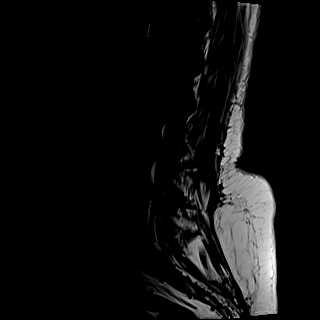
[im 12/15]
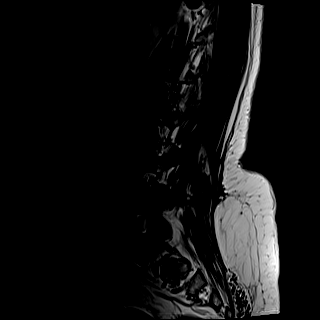
[im 15/15]
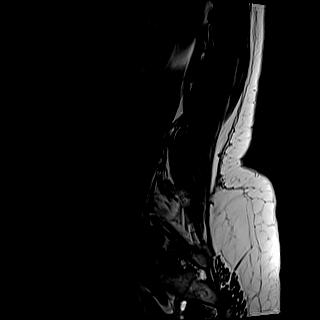

[Series 6: T2 · sagittal · 4.0mm · 0.81mm/px · 6 of 15 slices shown (1 of 2)]
[im 1/15]
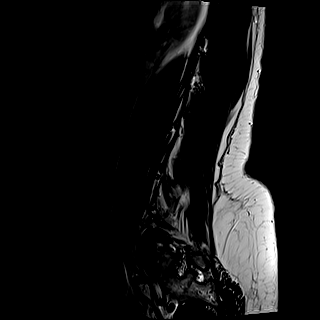
[im 3/15]
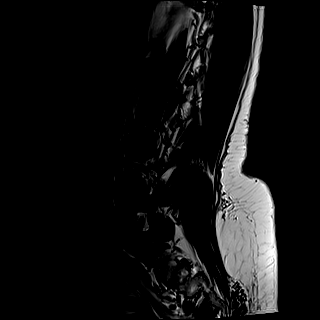
[im 6/15]
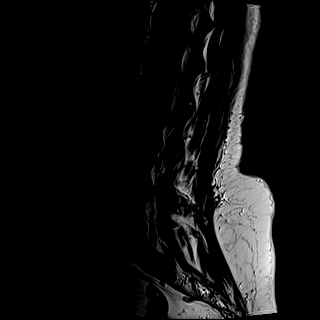
[im 9/15]
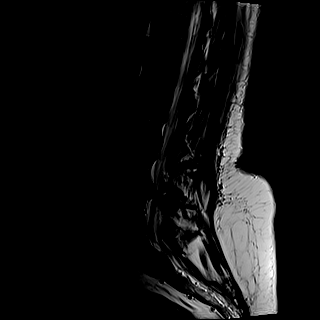
[im 12/15]
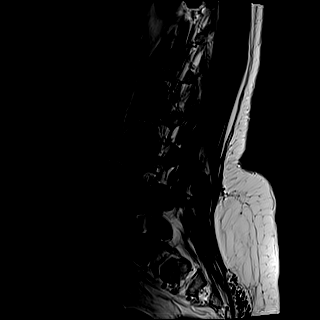
[im 15/15]
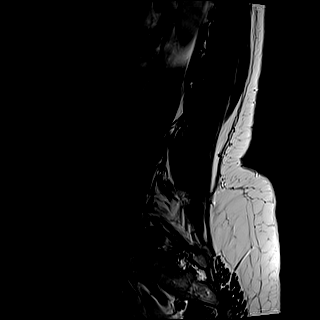

[Series 7: STIR · sagittal · 4.0mm · 0.51mm/px · 1 of 15 slices shown]
[im 1/15]
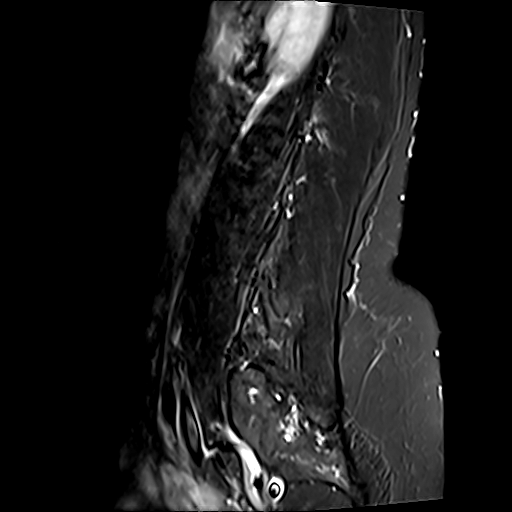

[Series 8: T2 · axial · 4.0mm · 0.62mm/px · z∈[-36,+172]mm · 9 of 40 slices shown (2 of 2)]
[im 1/40]
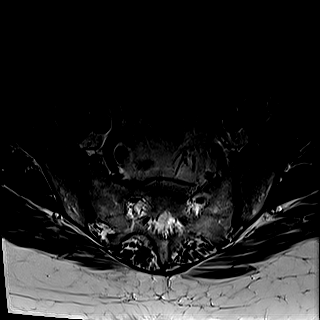
[im 6/40]
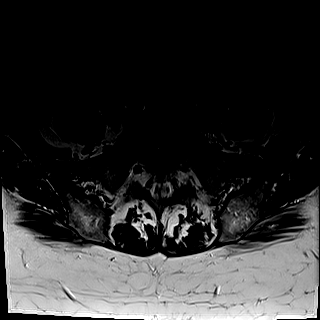
[im 12/40]
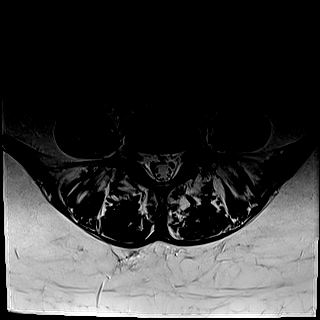
[im 17/40]
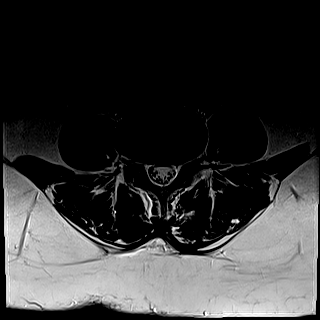
[im 20/40]
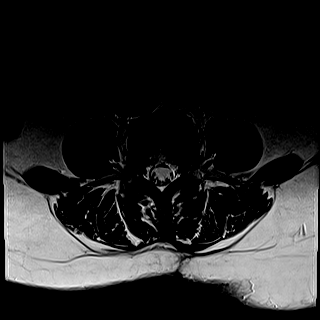
[im 23/40]
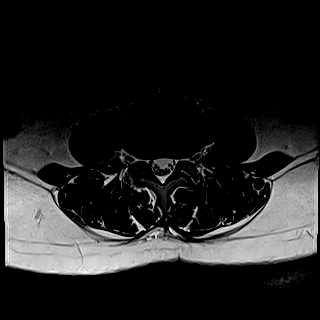
[im 28/40]
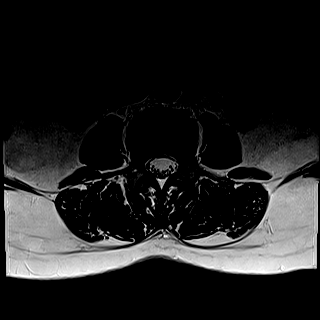
[im 34/40]
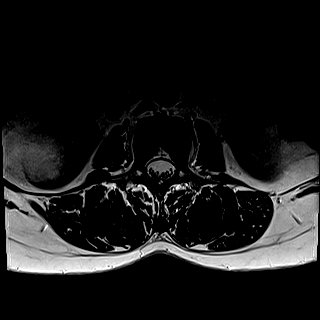
[im 40/40]
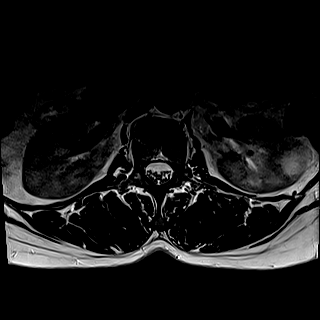

[Series 9: T1 · axial · 4.0mm · 0.39mm/px · z∈[-36,+172]mm · 9 of 40 slices shown (2 of 2)]
[im 1/40]
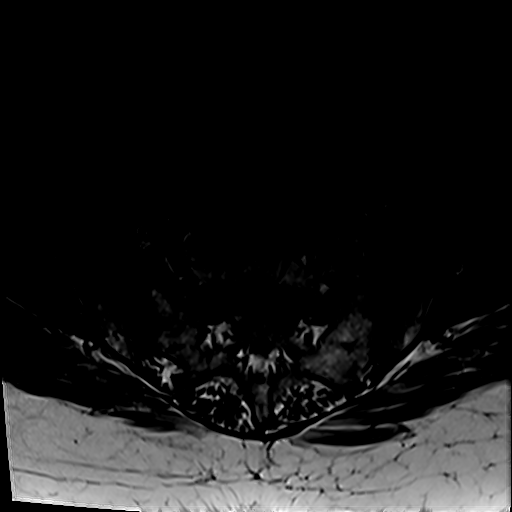
[im 6/40]
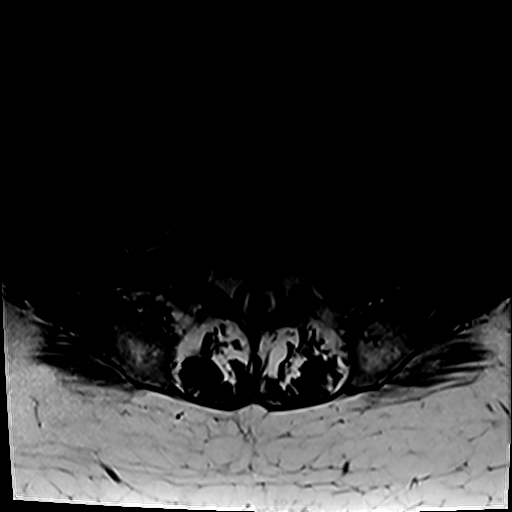
[im 12/40]
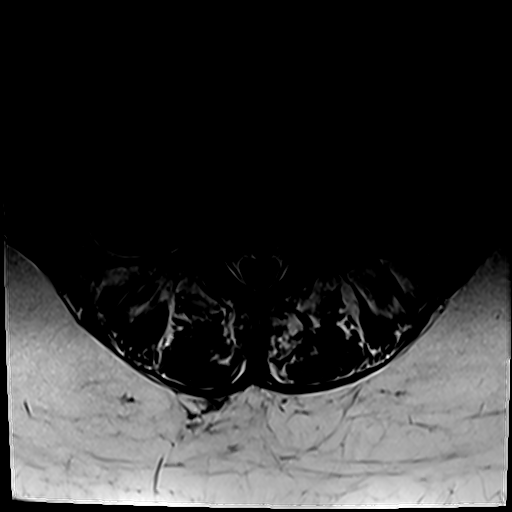
[im 17/40]
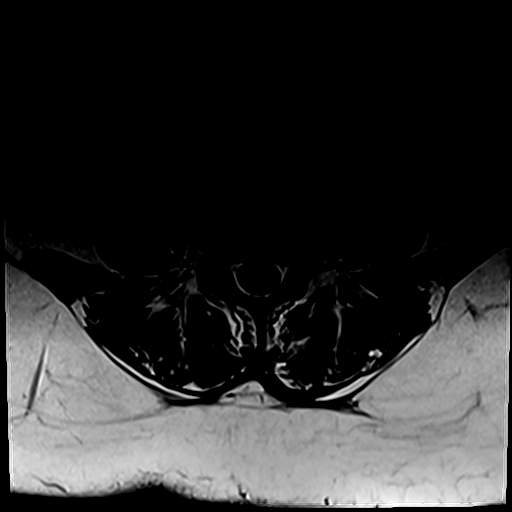
[im 20/40]
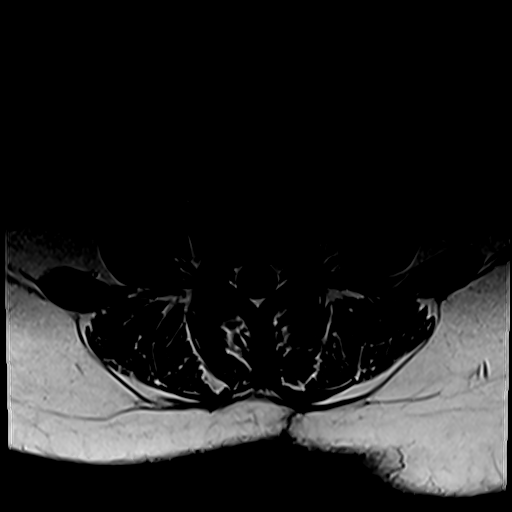
[im 23/40]
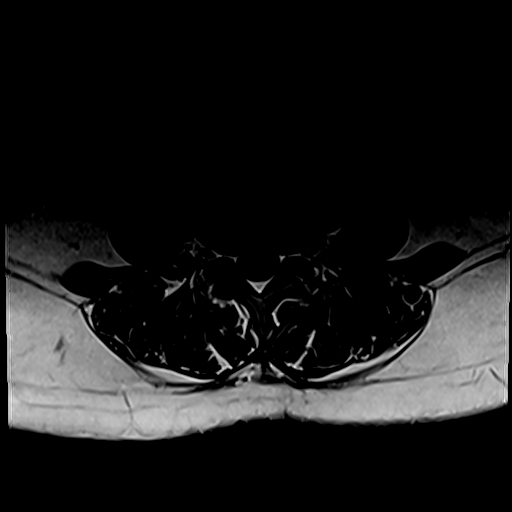
[im 28/40]
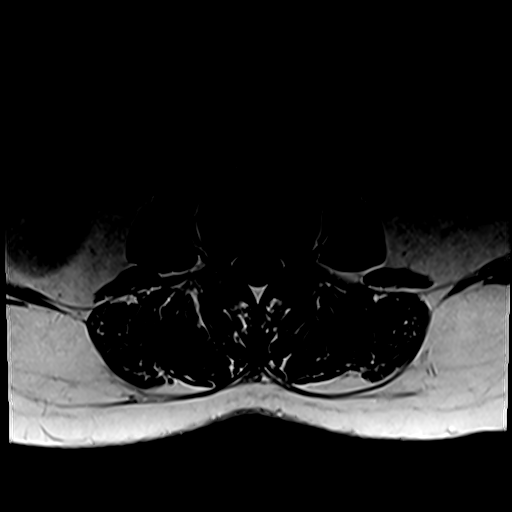
[im 34/40]
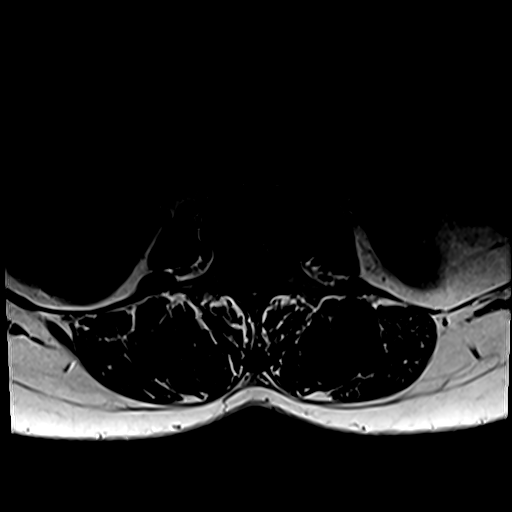
[im 40/40]
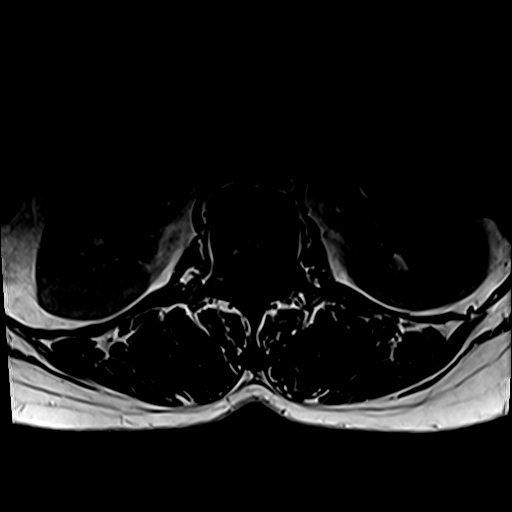

[31 of 48 positions shown; findings below may reference images not displayed]

FINDINGS: Segmentation: 5 lumbar type vertebrae based on the available
coverage

Alignment:  Physiologic

Vertebrae: No fracture, evidence of discitis, or aggressive bone
lesion. T1 hypointense structure following the posterior cortex of
the sacrum, nondescript on axial slices and likely venous plexus. No
interval change.

Conus medullaris and cauda equina: Conus extends to the L1 level.
Conus and cauda equina appear normal.

Paraspinal and other soft tissues: No perispinal mass or
inflammation is seen. Bilateral renal cystic intensities.

Disc levels:

L3-4 disc narrowing and bulging without impingement.

L4-5 disc bulging with left paracentral protrusion. The protrusion
has increased from before and impinges on the descending L5 nerve
root, progressive stenosis despite interval left laminotomy.

L5-S1: No impingement.
IMPRESSION: L4-5 recurrent or progressive left paracentral protrusion with left
L5 impingement. Left subarticular recess stenosis has progressed
despite an interval left laminotomy.

## 2020-08-10 NOTE — Progress Notes (Signed)
    SUBJECTIVE:   CHIEF COMPLAINT / HPI:   For the past week patient has been having pain with bowel movements.  Has not noticed any bleeding with bowel movements.  Does feel like there is something swollen down there.  Previous to this has had issues with constipation and not going to the bathroom every day.  Also having to strain to go to the bathroom.  Patient has never had a colonoscopy that she is aware of.  PERTINENT  PMH / PSH:   OBJECTIVE:   BP 102/74   Pulse 75   Ht 5\' 8"  (1.727 m)   Wt 164 lb 12.8 oz (74.8 kg)   LMP 10/19/2018 (Approximate)   SpO2 97%   BMI 25.06 kg/m   General: Alert, oriented.  Accompanied by daughter. Rectal: Swollen external hemorrhoid located on the left anterior lateral side of the anus.  Tender to palpation.  No fissures.  ASSESSMENT/PLAN:   External hemorrhoid 1 week of pain with defecation.  On exam obvious external hemorrhoid appreciated.  No fissures.  Advised patient to use MiraLAX or Metamucil with goal of 1-2 soft bowel movements a day and prescribed hydrocortisone 2.5% rectal cream to use up to 4 times a day.  We will send in referral to GI for colonoscopy.     12/19/2018, MD [facility]

## 2020-08-11 ENCOUNTER — Ambulatory Visit (INDEPENDENT_AMBULATORY_CARE_PROVIDER_SITE_OTHER): Payer: Medicaid Other | Admitting: Family Medicine

## 2020-08-11 ENCOUNTER — Other Ambulatory Visit: Payer: Self-pay

## 2020-08-11 VITALS — BP 102/74 | HR 75 | Ht 68.0 in | Wt 164.8 lb

## 2020-08-11 DIAGNOSIS — Z1211 Encounter for screening for malignant neoplasm of colon: Secondary | ICD-10-CM | POA: Diagnosis not present

## 2020-08-11 DIAGNOSIS — Z1212 Encounter for screening for malignant neoplasm of rectum: Secondary | ICD-10-CM | POA: Diagnosis not present

## 2020-08-11 DIAGNOSIS — K644 Residual hemorrhoidal skin tags: Secondary | ICD-10-CM | POA: Diagnosis present

## 2020-08-11 MED ORDER — HYDROCORTISONE (PERIANAL) 2.5 % EX CREA: 1.0000 "application " | TOPICAL_CREAM | Freq: Four times a day (QID) | CUTANEOUS | 0 refills | Status: DC

## 2020-08-11 NOTE — Patient Instructions (Signed)
It was nice to see you today,  For your hemorrhoids, I would recommend using a stool softener such as MiraLAX or fiber supplement such as Metamucil with the goal of having 1-2 soft bowel movements a day.  You should not be straining when having a bowel movement.  Straining is what causes hemorrhoids.  I have prescribed you a ointment to put on the area that hurts up to 4 times a day for the next week.  If you are symptoms or not getting better in the next week, please let us know.  Have a great day,  Frederic Jericho, MD

## 2020-08-11 NOTE — Assessment & Plan Note (Signed)
1 week of pain with defecation.  On exam obvious external hemorrhoid appreciated.  No fissures.  Advised patient to use MiraLAX or Metamucil with goal of 1-2 soft bowel movements a day and prescribed hydrocortisone 2.5% rectal cream to use up to 4 times a day.  We will send in referral to GI for colonoscopy.

## 2020-11-24 ENCOUNTER — Other Ambulatory Visit: Payer: Self-pay | Admitting: Neurological Surgery

## 2020-12-01 NOTE — Progress Notes (Signed)
Surgical Instructions    Your procedure is scheduled on Monday September 19.  Report to [facility] Main Entrance "A" at 12:15 PM., then check in with the Admitting office.  Call this number if you have problems the morning of surgery:  [phone]   If you have any questions prior to your surgery date call [phone]: Open Monday-Friday 8am-4pm    Remember:  Do not eat after midnight the night before your surgery  You may drink clear liquids until 11:15am the morning of your surgery.   Clear liquids allowed are: Water, Non-Citrus Juices (without pulp), Carbonated Beverages, Clear Tea, Black Coffee ONLY (NO MILK, CREAM OR POWDERED CREAMER of any kind), and Gatorade    Take these medicines the morning of surgery with A SIP OF WATER : HYDROcodone-acetaminophen (NORCO) if needed   As of today, STOP taking any Aspirin (unless otherwise instructed by your surgeon) Aleve, Naproxen, Ibuprofen, Motrin, Advil, Goody's, BC's, all herbal medications, fish oil, and all vitamins.          Do not wear jewelry or makeup Do not wear lotions, powders, perfumes/colognes, or deodorant. Do not shave 48 hours prior to surgery.  Men may shave face and neck. Do not bring valuables to the hospital. DO Not wear nail polish, gel polish, artificial nails, or any other type of covering on natural nails including finger and toenails. If patients have artificial nails, gel coating, etc. that need to be removed by a nail salon please have this removed prior to surgery or surgery may need to be canceled/delayed if the surgeon/ anesthesia feels like the patient is unable to be adequately monitored.             [facility] is not responsible for any belongings or valuables.  Do NOT Smoke (Tobacco/Vaping)  24 hours prior to your procedure If you use a CPAP at night, you may bring your mask for your overnight stay.   Contacts, glasses, dentures or bridgework may not be worn into surgery, please bring cases for  these belongings   For patients admitted to the hospital, discharge time will be determined by your treatment team.   Patients discharged the day of surgery will not be allowed to drive home, and someone needs to stay with them for 24 hours.  NO VISITORS WILL BE ALLOWED IN PRE-OP WHERE PATIENTS GET READY FOR SURGERY.  ONLY 1 SUPPORT PERSON MAY BE PRESENT WHILE YOU ARE IN SURGERY.  IF YOU ARE TO BE ADMITTED, ONCE YOU ARE IN YOUR ROOM YOU WILL BE ALLOWED TWO (2) VISITORS.  Minor children may have two parents present. Special consideration for safety and communication needs will be reviewed on a case by case basis.  Special instructions:    Oral Hygiene is also important to reduce your risk of infection.  Remember - BRUSH YOUR TEETH THE MORNING OF SURGERY WITH YOUR REGULAR TOOTHPASTE   [place]- Preparing For Surgery  Before surgery, you can play an important role. Because skin is not sterile, your skin needs to be as free of germs as possible. You can reduce the number of germs on your skin by washing with CHG (chlorahexidine gluconate) Soap before surgery.  CHG is an antiseptic cleaner which kills germs and bonds with the skin to continue killing germs even after washing.     Please do not use if you have an allergy to CHG or antibacterial soaps. If your skin becomes reddened/irritated stop using the CHG.  Do not shave (including legs  and underarms) for at least 48 hours prior to first CHG shower. It is OK to shave your face.  Please follow these instructions carefully.     Shower the NIGHT BEFORE SURGERY and the MORNING OF SURGERY with CHG Soap.   If you chose to wash your hair, wash your hair first as usual with your normal shampoo. After you shampoo, rinse your hair and body thoroughly to remove the shampoo.  Then [employer] and genitals (private parts) with your normal soap and rinse thoroughly to remove soap.  After that Use CHG Soap as you would any other liquid soap. You can apply  CHG directly to the skin and wash gently with a scrungie or a clean washcloth.   Apply the CHG Soap to your body ONLY FROM THE NECK DOWN.  Do not use on open wounds or open sores. Avoid contact with your eyes, ears, mouth and genitals (private parts). Wash Face and genitals (private parts)  with your normal soap.   Wash thoroughly, paying special attention to the area where your surgery will be performed.  Thoroughly rinse your body with warm water from the neck down.  DO NOT shower/wash with your normal soap after using and rinsing off the CHG Soap.  Pat yourself dry with a CLEAN TOWEL.  Wear CLEAN PAJAMAS to bed the night before surgery  Place CLEAN SHEETS on your bed the night before your surgery  DO NOT SLEEP WITH PETS.   Day of Surgery:  Take a shower with CHG soap. Wear Clean/Comfortable clothing the morning of surgery Do not apply any deodorants/lotions.   Remember to brush your teeth WITH YOUR REGULAR TOOTHPASTE.   Please read over the following fact sheets that you were given.

## 2020-12-02 ENCOUNTER — Encounter (HOSPITAL_COMMUNITY): Payer: Self-pay

## 2020-12-02 ENCOUNTER — Encounter (HOSPITAL_COMMUNITY)
Admission: RE | Admit: 2020-12-02 | Discharge: 2020-12-02 | Disposition: A | Discharge status: Home / Self Care | Payer: Medicaid Other | Source: Ambulatory Visit | Attending: Neurological Surgery | Admitting: Neurological Surgery

## 2020-12-02 ENCOUNTER — Other Ambulatory Visit: Payer: Self-pay

## 2020-12-02 DIAGNOSIS — Z01812 Encounter for preprocedural laboratory examination: Secondary | ICD-10-CM | POA: Insufficient documentation

## 2020-12-02 DIAGNOSIS — Z20822 Contact with and (suspected) exposure to covid-19: Secondary | ICD-10-CM | POA: Diagnosis not present

## 2020-12-02 LAB — SURGICAL PCR SCREEN
MRSA, PCR: NEGATIVE
Staphylococcus aureus: NEGATIVE

## 2020-12-02 LAB — CBC
HCT: 39.2 % (ref 36.0–46.0)
Hemoglobin: 13 g/dL (ref 12.0–15.0)
MCH: 28.6 pg (ref 26.0–34.0)
MCHC: 33.2 g/dL (ref 30.0–36.0)
MCV: 86.3 fL (ref 80.0–100.0)
Platelets: 290 10*3/uL (ref 150–400)
RBC: 4.54 MIL/uL (ref 3.87–5.11)
RDW: 13.2 % (ref 11.5–15.5)
WBC: 4.4 10*3/uL (ref 4.0–10.5)
nRBC: 0 % (ref 0.0–0.2)

## 2020-12-02 LAB — SARS CORONAVIRUS 2 (TAT 6-24 HRS): SARS Coronavirus 2: NEGATIVE

## 2020-12-02 NOTE — Progress Notes (Signed)
PCP - Dr. Derrel Nip Cardiologist - denies  PPM/ICD - denies  Chest x-ray - 04/17/16 EKG - denies Stress Test - denies ECHO - denies Cardiac Cath - denies  Sleep Study - denies   DM: denies  Blood Thinner Instructions: n/a Aspirin Instructions: n/a  ERAS Protcol - yes, no drink   COVID TEST- 12/02/20 at PAT appt, results pending   Anesthesia review: No  Patient denies shortness of breath, fever, cough and chest pain at PAT appointment   All instructions explained to the patient, with a verbal understanding of the material. Patient agrees to go over the instructions while at home for a better understanding. The opportunity to ask questions was provided.

## 2020-12-06 ENCOUNTER — Ambulatory Visit (HOSPITAL_COMMUNITY): Payer: Medicaid Other | Admitting: Anesthesiology

## 2020-12-06 ENCOUNTER — Encounter (HOSPITAL_COMMUNITY): Payer: Self-pay | Admitting: Neurological Surgery

## 2020-12-06 ENCOUNTER — Observation Stay (HOSPITAL_COMMUNITY)
Admission: RE | Admit: 2020-12-06 | Discharge: 2020-12-06 | Disposition: A | Discharge status: Home / Self Care | Payer: Medicaid Other | Attending: Neurological Surgery | Admitting: Neurological Surgery

## 2020-12-06 ENCOUNTER — Ambulatory Visit (HOSPITAL_COMMUNITY): Payer: Medicaid Other

## 2020-12-06 ENCOUNTER — Encounter (HOSPITAL_COMMUNITY)
Admission: RE | Disposition: A | Discharge status: Home / Self Care | Payer: Self-pay | Source: Home / Self Care | Attending: Neurological Surgery

## 2020-12-06 ENCOUNTER — Other Ambulatory Visit: Payer: Self-pay

## 2020-12-06 DIAGNOSIS — M5116 Intervertebral disc disorders with radiculopathy, lumbar region: Secondary | ICD-10-CM | POA: Diagnosis not present

## 2020-12-06 DIAGNOSIS — M5416 Radiculopathy, lumbar region: Secondary | ICD-10-CM | POA: Diagnosis present

## 2020-12-06 DIAGNOSIS — Z419 Encounter for procedure for purposes other than remedying health state, unspecified: Secondary | ICD-10-CM

## 2020-12-06 HISTORY — PX: LUMBAR LAMINECTOMY/ DECOMPRESSION WITH MET-RX: SHX5959

## 2020-12-06 IMAGING — RF DG LUMBAR SPINE 1V
1 series · 1 of 1 positions shown · non-contrast
Comparison: MRI [DATE]

CLINICAL DATA: Left lumbar for 5 minimally invasive discectomy

EXAM:
LUMBAR SPINE - 1 VIEW

[Series 1: run · 1 of 1 slices shown]
[im 1/1]
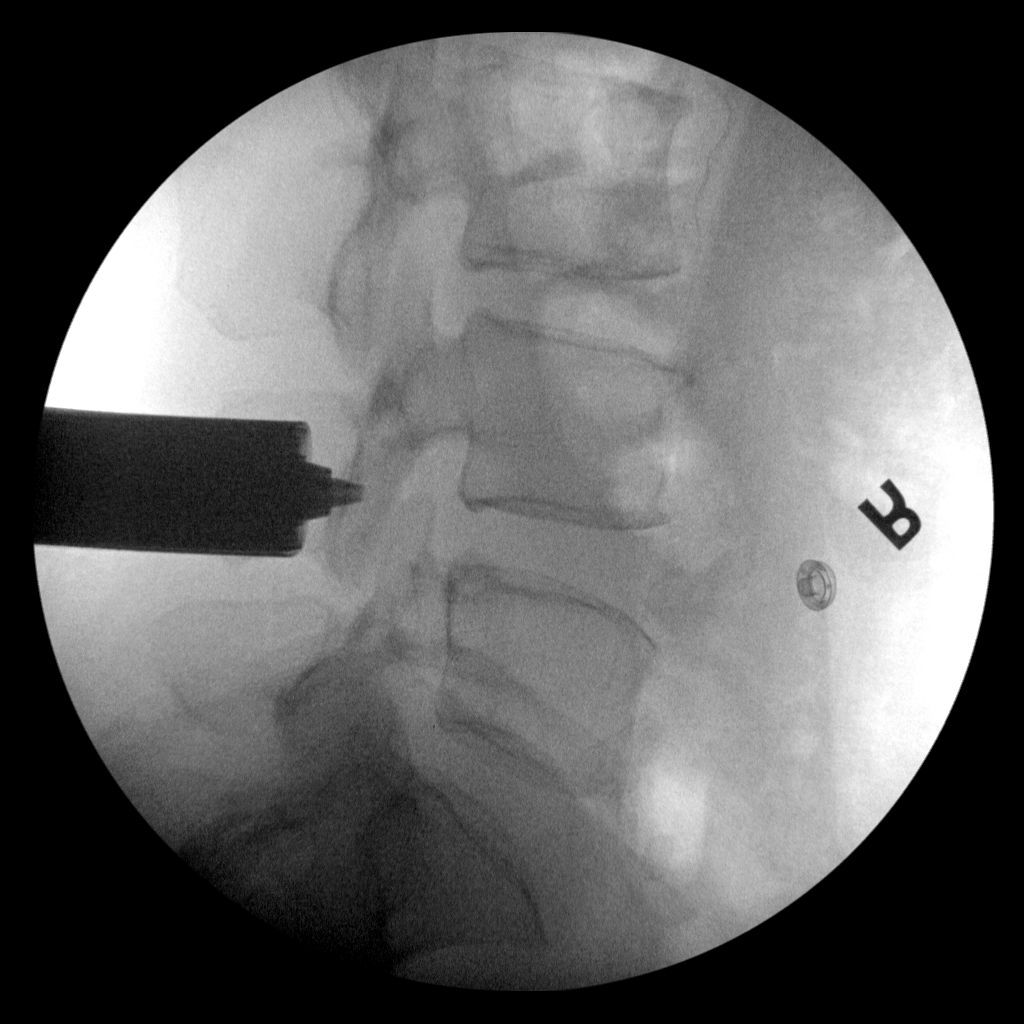

[1 of 1 positions shown; findings below may reference images not displayed]

FINDINGS: Intraoperative fluoroscopy is obtained for surgical control
purposes. Fluoroscopy time is recorded at 6 seconds. Dose recorded
at 3.38 mGy. A single spot fluoroscopic images obtained.

Spot fluoroscopic image demonstrates a localization marker over the
posterior elements posterior to the L4-5 interspace.
IMPRESSION: Intraoperative fluoroscopy obtained for surgical control purposes
the marker placed posterior to the L4-5 interspace

## 2020-12-06 SURGERY — LUMBAR LAMINECTOMY/ DECOMPRESSION WITH MET-RX
Anesthesia: General | Laterality: Left

## 2020-12-06 MED ORDER — DEXAMETHASONE SODIUM PHOSPHATE 10 MG/ML IJ SOLN
INTRAMUSCULAR | Status: DC | PRN
  Administered 2020-12-06: 8 mg via INTRAVENOUS

## 2020-12-06 MED ORDER — ROCURONIUM BROMIDE 10 MG/ML (PF) SYRINGE
PREFILLED_SYRINGE | INTRAVENOUS | Status: AC
  Filled 2020-12-06: qty 10

## 2020-12-06 MED ORDER — PROPOFOL 500 MG/50ML IV EMUL
INTRAVENOUS | Status: DC | PRN
  Administered 2020-12-06: 25 ug/kg/min via INTRAVENOUS

## 2020-12-06 MED ORDER — CEFAZOLIN SODIUM 1 G IJ SOLR
INTRAMUSCULAR | Status: AC
  Filled 2020-12-06: qty 10

## 2020-12-06 MED ORDER — CEFAZOLIN SODIUM-DEXTROSE 2-4 GM/100ML-% IV SOLN
2.0000 g | INTRAVENOUS | Status: AC
  Administered 2020-12-06: 2 g via INTRAVENOUS
  Filled 2020-12-06: qty 100

## 2020-12-06 MED ORDER — FENTANYL CITRATE (PF) 100 MCG/2ML IJ SOLN
INTRAMUSCULAR | Status: AC
  Filled 2020-12-06: qty 2

## 2020-12-06 MED ORDER — LACTATED RINGERS IV SOLN: INTRAVENOUS | Status: DC | PRN

## 2020-12-06 MED ORDER — CHLORHEXIDINE GLUCONATE CLOTH 2 % EX PADS: 6.0000 | MEDICATED_PAD | Freq: Once | CUTANEOUS | Status: DC

## 2020-12-06 MED ORDER — LIDOCAINE-EPINEPHRINE 1 %-1:100000 IJ SOLN
INTRAMUSCULAR | Status: AC
  Filled 2020-12-06: qty 1

## 2020-12-06 MED ORDER — DEXAMETHASONE SODIUM PHOSPHATE 10 MG/ML IJ SOLN
INTRAMUSCULAR | Status: AC
  Filled 2020-12-06: qty 1

## 2020-12-06 MED ORDER — SUCCINYLCHOLINE CHLORIDE 200 MG/10ML IV SOSY
PREFILLED_SYRINGE | INTRAVENOUS | Status: AC
  Filled 2020-12-06: qty 10

## 2020-12-06 MED ORDER — THROMBIN 5000 UNITS EX SOLR
OROMUCOSAL | Status: DC | PRN
  Administered 2020-12-06: 5 mL via TOPICAL

## 2020-12-06 MED ORDER — CHLORHEXIDINE GLUCONATE 0.12 % MT SOLN
15.0000 mL | Freq: Once | OROMUCOSAL | Status: AC
  Administered 2020-12-06: 15 mL via OROMUCOSAL
  Filled 2020-12-06: qty 15

## 2020-12-06 MED ORDER — THROMBIN 5000 UNITS EX SOLR
CUTANEOUS | Status: AC
  Filled 2020-12-06: qty 5000

## 2020-12-06 MED ORDER — PHENYLEPHRINE HCL-NACL 20-0.9 MG/250ML-% IV SOLN
INTRAVENOUS | Status: DC | PRN
  Administered 2020-12-06: 10 ug/min via INTRAVENOUS
  Administered 2020-12-06: 20 ug/min via INTRAVENOUS

## 2020-12-06 MED ORDER — AMISULPRIDE (ANTIEMETIC) 5 MG/2ML IV SOLN: 10.0000 mg | Freq: Once | INTRAVENOUS | Status: DC | PRN

## 2020-12-06 MED ORDER — PHENYLEPHRINE 40 MCG/ML (10ML) SYRINGE FOR IV PUSH (FOR BLOOD PRESSURE SUPPORT)
PREFILLED_SYRINGE | INTRAVENOUS | Status: AC
  Filled 2020-12-06: qty 10

## 2020-12-06 MED ORDER — CEFAZOLIN SODIUM 1 G IJ SOLR
INTRAMUSCULAR | Status: AC
  Filled 2020-12-06: qty 20

## 2020-12-06 MED ORDER — ROCURONIUM BROMIDE 10 MG/ML (PF) SYRINGE
PREFILLED_SYRINGE | INTRAVENOUS | Status: AC
  Filled 2020-12-06: qty 20

## 2020-12-06 MED ORDER — ONDANSETRON HCL 4 MG/2ML IJ SOLN
INTRAMUSCULAR | Status: DC | PRN
  Administered 2020-12-06: 4 mg via INTRAVENOUS

## 2020-12-06 MED ORDER — LACTATED RINGERS IV SOLN: INTRAVENOUS | Status: DC

## 2020-12-06 MED ORDER — FENTANYL CITRATE (PF) 250 MCG/5ML IJ SOLN
INTRAMUSCULAR | Status: AC
  Filled 2020-12-06: qty 5

## 2020-12-06 MED ORDER — PROMETHAZINE HCL 25 MG/ML IJ SOLN: 6.2500 mg | INTRAMUSCULAR | Status: DC | PRN

## 2020-12-06 MED ORDER — ROCURONIUM BROMIDE 10 MG/ML (PF) SYRINGE
PREFILLED_SYRINGE | INTRAVENOUS | Status: DC | PRN
  Administered 2020-12-06: 70 mg via INTRAVENOUS

## 2020-12-06 MED ORDER — MIDAZOLAM HCL 2 MG/2ML IJ SOLN
INTRAMUSCULAR | Status: AC
  Filled 2020-12-06: qty 2

## 2020-12-06 MED ORDER — LIDOCAINE 2% (20 MG/ML) 5 ML SYRINGE
INTRAMUSCULAR | Status: AC
  Filled 2020-12-06: qty 10

## 2020-12-06 MED ORDER — ORAL CARE MOUTH RINSE: 15.0000 mL | Freq: Once | OROMUCOSAL | Status: AC

## 2020-12-06 MED ORDER — FENTANYL CITRATE (PF) 100 MCG/2ML IJ SOLN
25.0000 ug | INTRAMUSCULAR | Status: DC | PRN
  Administered 2020-12-06: 25 ug via INTRAVENOUS

## 2020-12-06 MED ORDER — ACETAMINOPHEN 500 MG PO TABS
1000.0000 mg | ORAL_TABLET | Freq: Once | ORAL | Status: AC
  Administered 2020-12-06: 1000 mg via ORAL
  Filled 2020-12-06: qty 2

## 2020-12-06 MED ORDER — OXYCODONE-ACETAMINOPHEN 5-325 MG PO TABS: 1.0000 | ORAL_TABLET | ORAL | 0 refills | Status: DC | PRN

## 2020-12-06 MED ORDER — 0.9 % SODIUM CHLORIDE (POUR BTL) OPTIME
TOPICAL | Status: DC | PRN
  Administered 2020-12-06: 1000 mL

## 2020-12-06 MED ORDER — LIDOCAINE 2% (20 MG/ML) 5 ML SYRINGE
INTRAMUSCULAR | Status: DC | PRN
  Administered 2020-12-06: 100 mg via INTRAVENOUS

## 2020-12-06 MED ORDER — ONDANSETRON HCL 4 MG/2ML IJ SOLN
INTRAMUSCULAR | Status: AC
  Filled 2020-12-06: qty 2

## 2020-12-06 MED ORDER — PROPOFOL 10 MG/ML IV BOLUS
INTRAVENOUS | Status: AC
  Filled 2020-12-06: qty 20

## 2020-12-06 MED ORDER — PROPOFOL 10 MG/ML IV BOLUS
INTRAVENOUS | Status: DC | PRN
  Administered 2020-12-06: 150 mg via INTRAVENOUS

## 2020-12-06 MED ORDER — FENTANYL CITRATE (PF) 250 MCG/5ML IJ SOLN
INTRAMUSCULAR | Status: DC | PRN
  Administered 2020-12-06: 150 ug via INTRAVENOUS
  Administered 2020-12-06: 100 ug via INTRAVENOUS

## 2020-12-06 MED ORDER — CELECOXIB 200 MG PO CAPS
200.0000 mg | ORAL_CAPSULE | Freq: Once | ORAL | Status: AC
  Administered 2020-12-06: 200 mg via ORAL
  Filled 2020-12-06: qty 1

## 2020-12-06 MED ORDER — MIDAZOLAM HCL 2 MG/2ML IJ SOLN
INTRAMUSCULAR | Status: DC | PRN
  Administered 2020-12-06: 2 mg via INTRAVENOUS

## 2020-12-06 MED ORDER — LIDOCAINE-EPINEPHRINE 1 %-1:100000 IJ SOLN
INTRAMUSCULAR | Status: DC | PRN
  Administered 2020-12-06: 10 mL

## 2020-12-06 SURGICAL SUPPLY — 46 items
BAG COUNTER SPONGE SURGICOUNT (BAG) ×4 IMPLANT
BAND RUBBER #18 3X1/16 STRL (MISCELLANEOUS) ×4 IMPLANT
BLADE CLIPPER SURG (BLADE) IMPLANT
BLADE SURG 11 STRL SS (BLADE) ×2 IMPLANT
BUR PRECISION FLUTE 5.0 (BURR) ×2 IMPLANT
BUR PRECISION MATCH 3.0 13 (BURR) IMPLANT
CANISTER SUCT 3000ML PPV (MISCELLANEOUS) ×2 IMPLANT
DECANTER SPIKE VIAL GLASS SM (MISCELLANEOUS) ×2 IMPLANT
DERMABOND ADVANCED (GAUZE/BANDAGES/DRESSINGS) ×1
DERMABOND ADVANCED .7 DNX12 (GAUZE/BANDAGES/DRESSINGS) ×1 IMPLANT
DRAPE C-ARM 42X72 X-RAY (DRAPES) ×4 IMPLANT
DRAPE LAPAROTOMY 100X72X124 (DRAPES) ×2 IMPLANT
DRAPE MICROSCOPE LEICA (MISCELLANEOUS) ×2 IMPLANT
DRAPE SURG 17X23 STRL (DRAPES) ×2 IMPLANT
DURAPREP 26ML APPLICATOR (WOUND CARE) ×2 IMPLANT
ELECT BLADE 6.5 EXT (BLADE) ×2 IMPLANT
ELECT REM PT RETURN 9FT ADLT (ELECTROSURGICAL) ×2
ELECTRODE REM PT RTRN 9FT ADLT (ELECTROSURGICAL) ×1 IMPLANT
GAUZE 4X4 16PLY ~~LOC~~+RFID DBL (SPONGE) ×2 IMPLANT
GAUZE SPONGE 4X4 12PLY STRL (GAUZE/BANDAGES/DRESSINGS) IMPLANT
GLOVE EXAM NITRILE LRG STRL (GLOVE) IMPLANT
GLOVE EXAM NITRILE XL STR (GLOVE) IMPLANT
GLOVE EXAM NITRILE XS STR PU (GLOVE) IMPLANT
GLOVE SURG LTX SZ7.5 (GLOVE) ×2 IMPLANT
GLOVE SURG UNDER POLY LF SZ7.5 (GLOVE) ×2 IMPLANT
GOWN STRL REUS W/ TWL LRG LVL3 (GOWN DISPOSABLE) ×2 IMPLANT
GOWN STRL REUS W/ TWL XL LVL3 (GOWN DISPOSABLE) IMPLANT
GOWN STRL REUS W/TWL 2XL LVL3 (GOWN DISPOSABLE) IMPLANT
GOWN STRL REUS W/TWL LRG LVL3 (GOWN DISPOSABLE) ×2
GOWN STRL REUS W/TWL XL LVL3 (GOWN DISPOSABLE)
HEMOSTAT POWDER KIT SURGIFOAM (HEMOSTASIS) ×2 IMPLANT
KIT BASIN OR (CUSTOM PROCEDURE TRAY) ×2 IMPLANT
KIT TURNOVER KIT B (KITS) ×2 IMPLANT
NEEDLE HYPO 22GX1.5 SAFETY (NEEDLE) ×2 IMPLANT
NEEDLE SPNL 18GX3.5 QUINCKE PK (NEEDLE) ×2 IMPLANT
NS IRRIG 1000ML POUR BTL (IV SOLUTION) ×2 IMPLANT
PACK LAMINECTOMY NEURO (CUSTOM PROCEDURE TRAY) ×2 IMPLANT
PAD ARMBOARD 7.5X6 YLW CONV (MISCELLANEOUS) ×6 IMPLANT
SPONGE T-LAP 4X18 ~~LOC~~+RFID (SPONGE) ×2 IMPLANT
SUT MNCRL AB 3-0 PS2 18 (SUTURE) ×2 IMPLANT
SUT VIC AB 0 CT1 18XCR BRD8 (SUTURE) IMPLANT
SUT VIC AB 0 CT1 8-18 (SUTURE)
SUT VIC AB 2-0 CP2 18 (SUTURE) ×2 IMPLANT
TOWEL GREEN STERILE (TOWEL DISPOSABLE) ×2 IMPLANT
TOWEL GREEN STERILE FF (TOWEL DISPOSABLE) ×2 IMPLANT
WATER STERILE IRR 1000ML POUR (IV SOLUTION) ×2 IMPLANT

## 2020-12-06 NOTE — H&P (Signed)
Surgical H&P Update  HPI: 55 y.o. woman with history of prior lumbar disc herniation with recurrence, MRI unfortunately showed recurrent disc material, consistent with her symptoms. I therefore recommended repeat discectomy. Today she is still having pain and wishes to proceed with surgery.  PMHx:  Past Medical History:  Diagnosis Date   Glaucoma    Right flank pain    Vision loss of right eye    FamHx: History reviewed. No pertinent family history. SocHx:  reports that she has never smoked. She has never used smokeless tobacco. She reports that she does not drink alcohol and does not use drugs.  Physical Exam: Strength 5/5 x4, SILTx4  Assesment/Plan: 55 y.o. woman with recurrent lumbar disc herniation, here for repeat L L4-5 MIS discectomy. Risks, benefits, and alternatives discussed and the patient would like to continue with surgery.  -OR today -3C post-op  Jadene Pierini, MD 12/06/20 2:39 PM

## 2020-12-06 NOTE — Op Note (Signed)
PATIENT: Carrie Rivas  DAY OF SURGERY: 12/06/20   PRE-OPERATIVE DIAGNOSIS:  Herniated nucleus pulposus with lumbar radiculopathy   POST-OPERATIVE DIAGNOSIS:  Herniated nucleus pulposus with lumbar radiculopathy   PROCEDURE:  Left repeat minimally invasive L4-5 discectomy   SURGEON:  Surgeon(s) and Role:    Jadene Pierini, MD - Primary   ANESTHESIA: ETGA   BRIEF HISTORY: This is a 55 year old woman who previously had an MIS discectomy with recurrence of her symptoms, MRI showed a recurrent disc herniation and and nerve root compression. I therefore recommended a repeat minimally invasive L4-5 discectomy. This was discussed with the patient as well as risks, benefits, and alternatives and the patient wished to proceed with surgical treatment.    OPERATIVE DETAIL: The patient was taken to the operating room and placed on the OR table in the prone position. A formal time out was performed with two patient identifiers and confirmed the operative site. Anesthesia was induced by the anesthesia team. The operative site was marked, hair was clipped with surgical clippers, the area was then prepped and draped in a sterile fashion. Fluoroscopy was used to identify the surgical level prior to incision.   The patient's prior incision was used at L4-5. This was anesthetized with local, incised and fingertip blunt dissection was used with the bovie to dissect down to the intact facet at L4-5 on the left. I then used fluoroscopy to dock serial dilators to the facet After a final dilator was placed, a tubular retractor was placed over this and secured to the table. The operating microscope was draped and brought into the field.   Anatomy was palpated and confirmed. I used a combination of monopolar cautery and blunt dissection to expand the lateral defect to get a better plane on the bone, then used this to resect the scar tissue of the thecal sac. As is usually the case, this was quite difficult with  adherent scar tissue that was quite dense. I was able to do so without a durotomy. I then performed a medial facetectomy and dissected free the lateral recess and epidural space. The disc space was identified and incised and a large amount of disc and scar tissue was removed from the disc space and epidural space. This was done with a combination of rongeurs and down-pushing curettes.   The traversing nerve root was palpated throughout its visible course to confirm good decompression. The thecal sac was palpated with long ball tipped probes to make sure it was free of compression where palpable.   Hemostasis was obtained, the wound was copiously irrigated, and the tube was removed while using the microscope to confirm hemostasis of the muscle edges. All instrument and sponge counts were correct and the incision was then closed in layers. The patient was then returned to anesthesia for emergence. No apparent complications at the completion of the procedure.    EBL:  18mL   DRAINS: none   SPECIMENS: none   Jadene Pierini, MD 12/06/20 5:34 PM

## 2020-12-06 NOTE — Transfer of Care (Signed)
Immediate Anesthesia Transfer of Care Note  Patient: Carrie Rivas  Procedure(s) Performed: Left Lumbar Four-Five Minimally invasive discectomy (Left)  Patient Location: PACU  Anesthesia Type:General  Level of Consciousness: awake, alert , oriented and patient cooperative  Airway & Oxygen Therapy: Patient Spontanous Breathing and Patient connected to face mask oxygen  Post-op Assessment: Report given to RN and Post -op Vital signs reviewed and stable  Post vital signs: Reviewed and stable  Last Vitals:  Vitals Value Taken Time  BP 125/81 12/06/20 1746  Temp    Pulse 78 12/06/20 1747  Resp 10 12/06/20 1747  SpO2 100 % 12/06/20 1747  Vitals shown include unvalidated device data.  Last Pain:  Vitals:   12/06/20 1247  TempSrc:   PainSc: 7       Patients Stated Pain Goal: 4 (12/06/20 1247)  Complications: No notable events documented.

## 2020-12-06 NOTE — Anesthesia Procedure Notes (Signed)
Procedure Name: Intubation Date/Time: 12/06/2020 3:45 PM Performed by: Minerva Ends, CRNA Pre-anesthesia Checklist: Patient identified, Emergency Drugs available, Suction available and Patient being monitored Patient Re-evaluated:Patient Re-evaluated prior to induction Oxygen Delivery Method: Circle system utilized Preoxygenation: Pre-oxygenation with 100% oxygen Induction Type: IV induction Ventilation: Mask ventilation without difficulty Laryngoscope Size: Mac and 3 Grade View: Grade I Tube type: Oral Tube size: 7.0 mm Number of attempts: 1 Airway Equipment and Method: Stylet and Oral airway Placement Confirmation: ETT inserted through vocal cords under direct vision, positive ETCO2 and breath sounds checked- equal and bilateral Secured at: 21 cm Tube secured with: Tape Dental Injury: Teeth and Oropharynx as per pre-operative assessment

## 2020-12-06 NOTE — Anesthesia Preprocedure Evaluation (Addendum)
Anesthesia Evaluation  Patient identified by MRN, date of birth, ID band Patient awake    Reviewed: Allergy & Precautions, NPO status , Patient's Chart, lab work & pertinent test results  History of Anesthesia Complications Negative for: history of anesthetic complications  Airway Mallampati: II  TM Distance: >3 FB Neck ROM: Full    Dental no notable dental hx. (+) Dental Advisory Given   Pulmonary neg pulmonary ROS,    Pulmonary exam normal        Cardiovascular negative cardio ROS Normal cardiovascular exam     Neuro/Psych negative psych ROS   GI/Hepatic negative GI ROS, Neg liver ROS,   Endo/Other  negative endocrine ROS  Renal/GU negative Renal ROS  negative genitourinary   Musculoskeletal negative musculoskeletal ROS (+)   Abdominal   Peds negative pediatric ROS (+)  Hematology negative hematology ROS (+)   Anesthesia Other Findings   Reproductive/Obstetrics negative OB ROS                            Anesthesia Physical  Anesthesia Plan  ASA: 2  Anesthesia Plan: General   Post-op Pain Management:    Induction: Intravenous  PONV Risk Score and Plan: 3 and Ondansetron, Dexamethasone, Midazolam and Treatment may vary due to age or medical condition  Airway Management Planned: Oral ETT  Additional Equipment:   Intra-op Plan:   Post-operative Plan: Extubation in OR  Informed Consent: I have reviewed the patients History and Physical, chart, labs and discussed the procedure including the risks, benefits and alternatives for the proposed anesthesia with the patient or authorized representative who has indicated his/her understanding and acceptance.     Dental advisory given  Plan Discussed with: Anesthesiologist and CRNA  Anesthesia Plan Comments:        Anesthesia Quick Evaluation

## 2020-12-07 ENCOUNTER — Encounter (HOSPITAL_COMMUNITY): Payer: Self-pay | Admitting: Neurological Surgery

## 2020-12-07 NOTE — Anesthesia Postprocedure Evaluation (Signed)
Anesthesia Post Note  Patient: Carrie Rivas  Procedure(s) Performed: Left Lumbar Four-Five Minimally invasive discectomy (Left)     Patient location during evaluation: PACU Anesthesia Type: General Level of consciousness: awake and alert Pain management: pain level controlled Vital Signs Assessment: post-procedure vital signs reviewed and stable Respiratory status: spontaneous breathing, nonlabored ventilation, respiratory function stable and patient connected to nasal cannula oxygen Cardiovascular status: blood pressure returned to baseline and stable Postop Assessment: no apparent nausea or vomiting Anesthetic complications: no   No notable events documented.  Last Vitals:  Vitals:   12/06/20 1830 12/06/20 1845  BP: (!) 148/96 (!) 138/93  Pulse: 64 (!) 57  Resp: 11 14  Temp:  (!) 36.1 C  SpO2: 98% 97%    Last Pain:  Vitals:   12/06/20 1845  TempSrc:   PainSc: Asleep                 Nelle Don Erisha Paugh

## 2020-12-07 NOTE — Discharge Summary (Signed)
Discharge Summary  Date of Admission: 12/06/2020  Date of Discharge: 12/06/20  Attending Physician: Autumn Patty, MD  Hospital Course: Patient was admitted following an uncomplicated repeat MIS discectomy. She was recovered in PACU and discharge home from PACU.  Neurologic exam at discharge:  Strength 5/5 x4, SILTx4  Discharge diagnosis: Lumbar radiculopathy  Jadene Pierini, MD 12/07/20 7:35 AM

## 2020-12-07 NOTE — Progress Notes (Signed)
 of Fentanyl wasted in stericycle with Rondel Jumbo, RN.

## 2020-12-27 ENCOUNTER — Other Ambulatory Visit (HOSPITAL_COMMUNITY): Payer: Self-pay | Admitting: Neurological Surgery

## 2020-12-27 ENCOUNTER — Other Ambulatory Visit: Payer: Self-pay | Admitting: Neurological Surgery

## 2020-12-27 DIAGNOSIS — M5416 Radiculopathy, lumbar region: Secondary | ICD-10-CM

## 2021-01-01 ENCOUNTER — Ambulatory Visit (HOSPITAL_COMMUNITY)
Admission: RE | Admit: 2021-01-01 | Discharge: 2021-01-01 | Disposition: A | Discharge status: Home / Self Care | Payer: Medicaid Other | Source: Ambulatory Visit | Attending: Neurological Surgery | Admitting: Neurological Surgery

## 2021-01-01 DIAGNOSIS — M5416 Radiculopathy, lumbar region: Secondary | ICD-10-CM | POA: Diagnosis present

## 2021-01-01 IMAGING — MR MR LUMBAR SPINE W/O CM
4 of 5 series · 30 of 48 positions shown · non-contrast
Comparison: [DATE].

CLINICAL DATA: Lumbar radiculopathy [XV] ([XV]-CM)

EXAM:
MRI LUMBAR SPINE WITHOUT CONTRAST
TECHNIQUE: Multiplanar, multisequence MR imaging of the lumbar spine was
performed. No intravenous contrast was administered.

[Series 5: T1 · sagittal · 4.0mm · 0.81mm/px · 6 of 17 slices shown (1 of 2)]
[im 1/17]
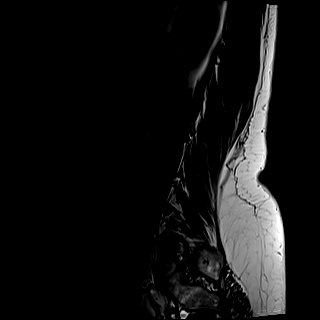
[im 4/17]
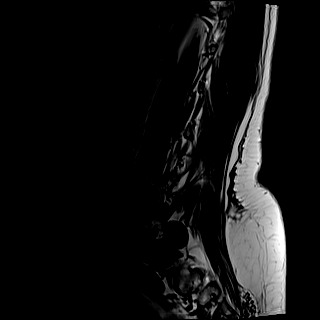
[im 7/17]
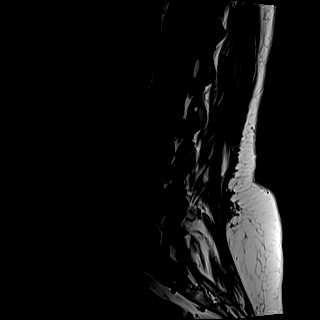
[im 10/17]
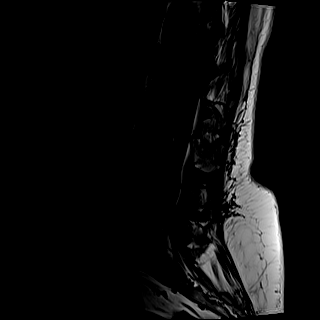
[im 13/17]
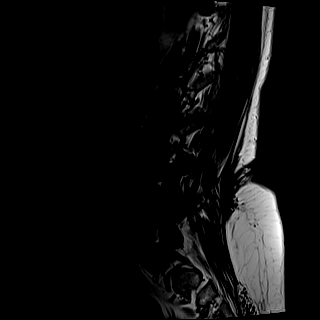
[im 17/17]
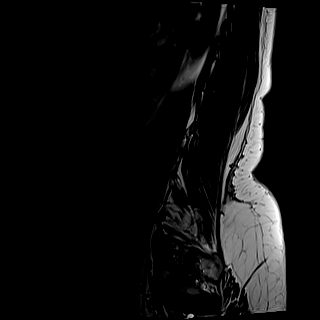

[Series 6: T2 · sagittal · 4.0mm · 0.81mm/px · 6 of 17 slices shown (1 of 2)]
[im 1/17]
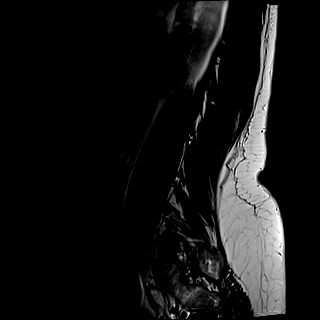
[im 4/17]
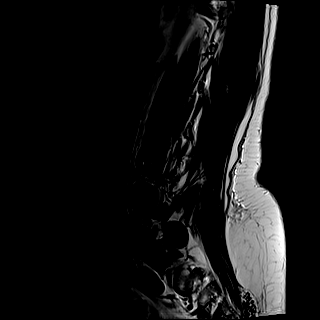
[im 7/17]
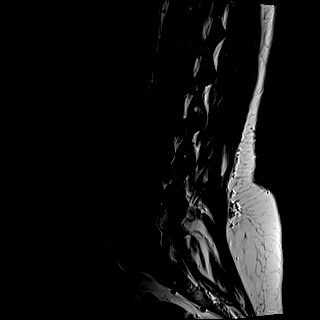
[im 10/17]
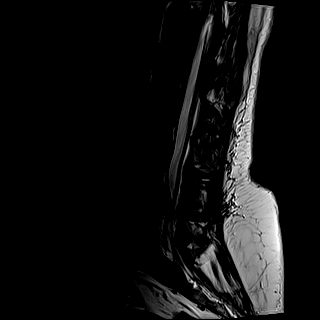
[im 13/17]
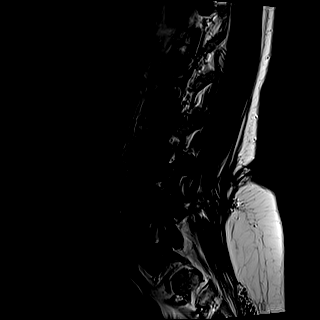
[im 17/17]
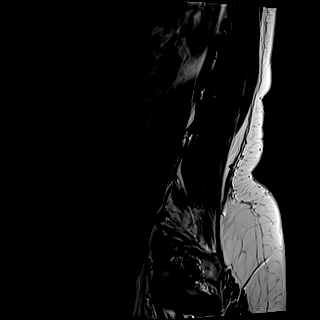

[Series 7: T2 · axial · 4.0mm · 0.62mm/px · z∈[+27,+263]mm · 9 of 43 slices shown (2 of 2)]
[im 1/43]
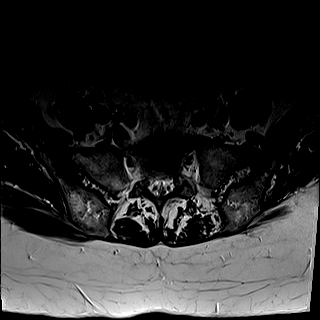
[im 7/43]
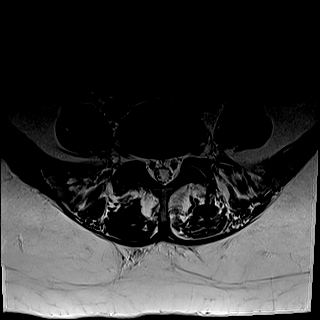
[im 13/43]
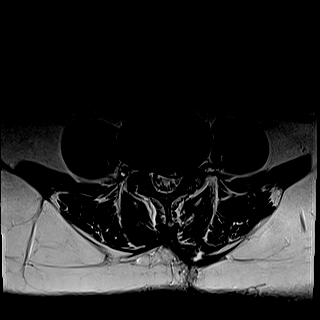
[im 19/43]
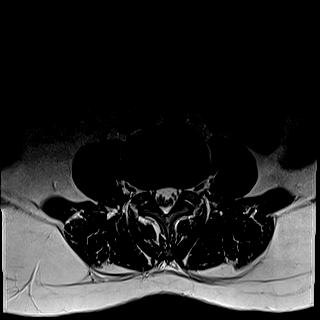
[im 22/43]
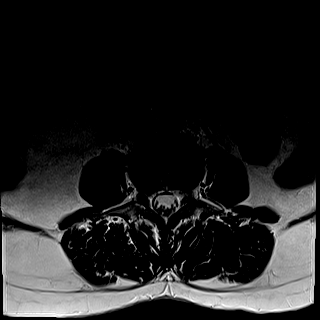
[im 25/43]
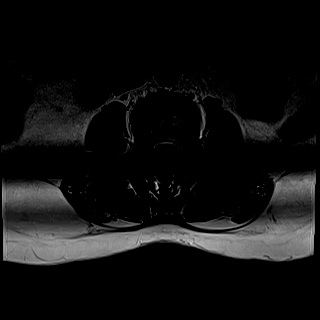
[im 31/43]
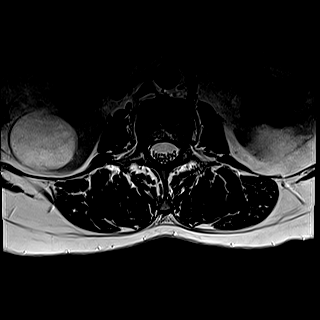
[im 37/43]
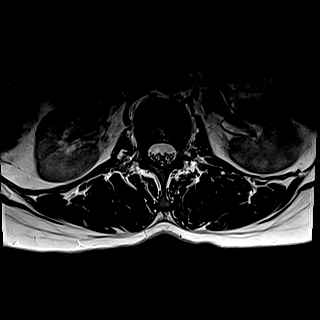
[im 43/43]
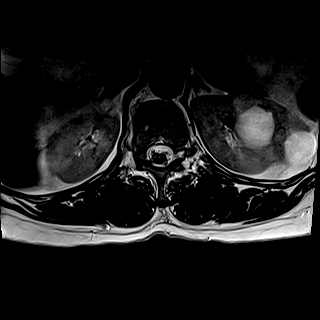

[Series 8: T1 · axial · 4.0mm · 0.39mm/px · z∈[+27,+263]mm · 9 of 43 slices shown (2 of 2)]
[im 1/43]
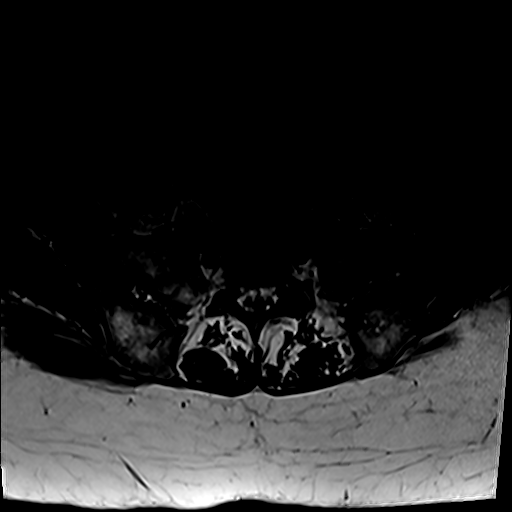
[im 7/43]
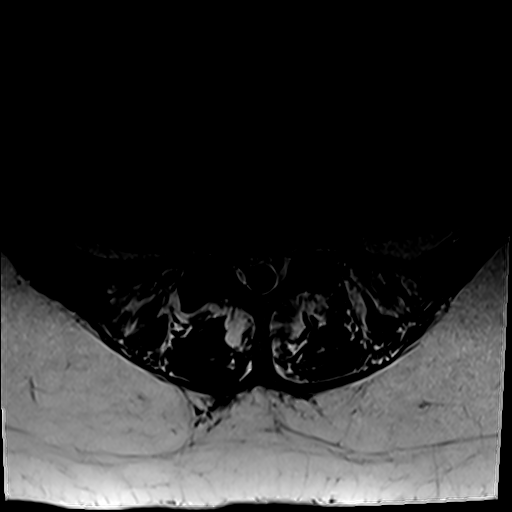
[im 13/43]
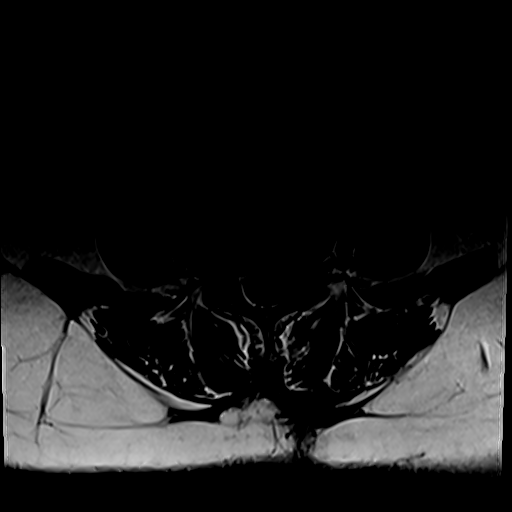
[im 19/43]
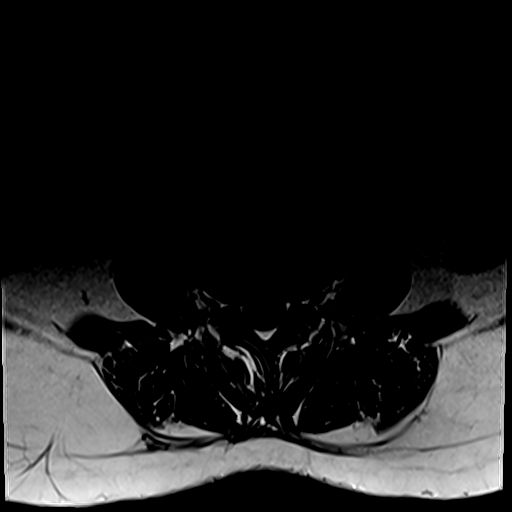
[im 22/43]
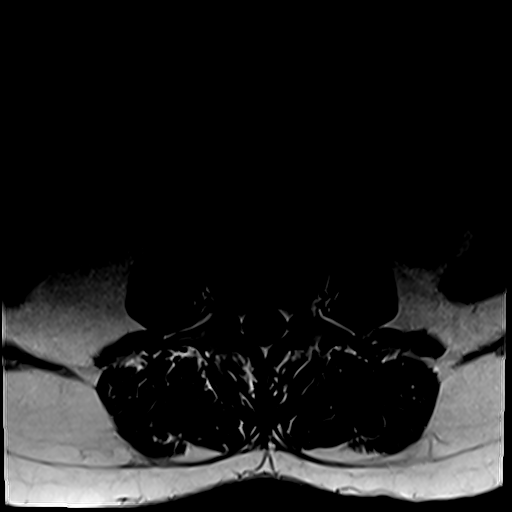
[im 25/43]
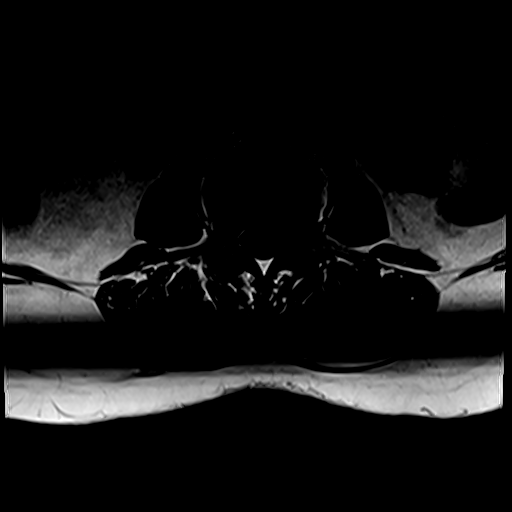
[im 31/43]
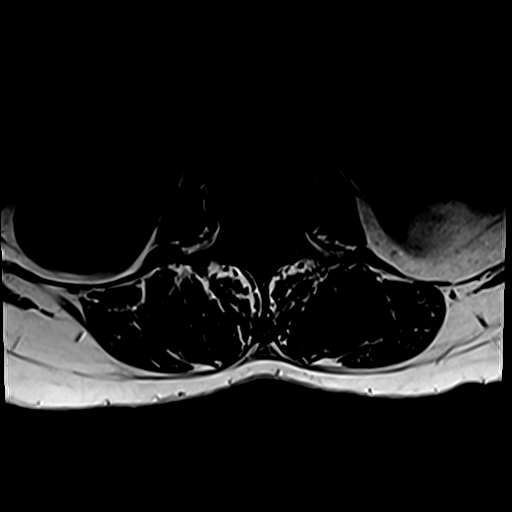
[im 37/43]
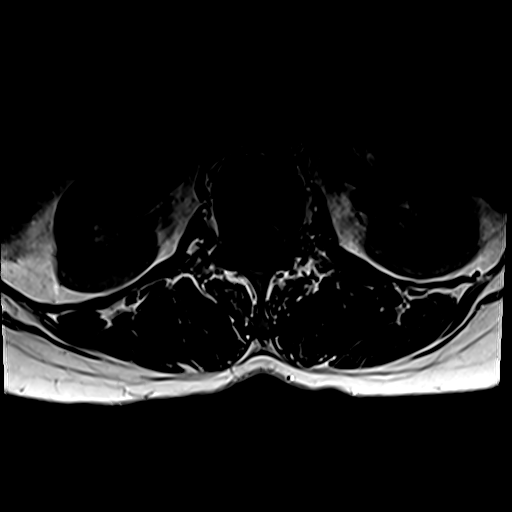
[im 43/43]
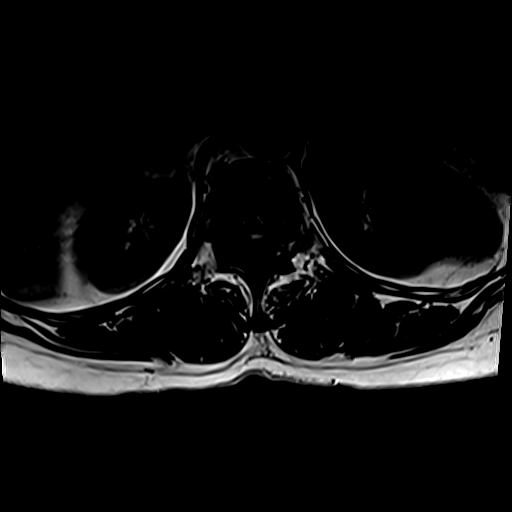

[30 of 48 positions shown; findings below may reference images not displayed]

FINDINGS: Segmentation: Standard segmentation is assumed. The inferior-most
fully formed intervertebral disc is labeled L5-S1.

Alignment: No substantial sagittal subluxation. Straightening.
Similar alignment.

Vertebrae: Degenerative/discogenic endplate signal changes along the
posterior/superior L5 endplate, likely a developing Schmorl's node.

Conus medullaris and cauda equina: Conus extends to the inferior L1
level. Conus and cauda equina appear normal.

Paraspinal and other soft tissues: Left renal cysts.

Disc levels:

T12-L1: No significant disc protrusion, foraminal stenosis, or canal
stenosis.

L1-L2: No significant disc protrusion, foraminal stenosis, or canal
stenosis.

L2-L3: No significant disc protrusion, foraminal stenosis, or canal
stenosis.

L3-L4: Broad disc bulge and mild bilateral facet arthropathy with
ligamentum flavum thickening. No significant canal or foraminal
stenosis. No change.

L4-L5: Evidence of prior left laminotomy. Extensive T1 hypointense
probable scar tissue in the left epidural space and left posterior
paraspinal soft tissues with left laminotomy. Postsurgical change
and scar tissue limits evaluation (particular without contrast) with
suspected interval increase in size of a large left subarticular
disc protrusion with new inferior migration and probable severe left
subarticular recess stenosis with impingement of descending left L5
nerve roots.

L5-S1: Similar mild disc bulge and mild bilateral facet arthropathy
without significant canal or foraminal stenosis.
IMPRESSION: Postsurgical change/scar tissue limits evaluation (particularly
without contrast) with suspected interval increase in large left
subarticular disc protrusion at L4-L5 with new inferior migration of
disc and probable severe left subarticular stenosis with impingement
of descending left L5 nerve roots.

Edema about the posterior/superior L5 endplate, likely a developing
Schmorl's node.

## 2021-04-01 ENCOUNTER — Ambulatory Visit (INDEPENDENT_AMBULATORY_CARE_PROVIDER_SITE_OTHER): Payer: Medicaid Other | Admitting: Family Medicine

## 2021-04-01 ENCOUNTER — Other Ambulatory Visit: Payer: Self-pay

## 2021-04-01 ENCOUNTER — Encounter: Payer: Self-pay | Admitting: Family Medicine

## 2021-04-01 DIAGNOSIS — J019 Acute sinusitis, unspecified: Secondary | ICD-10-CM

## 2021-04-01 MED ORDER — AMOXICILLIN-POT CLAVULANATE 875-125 MG PO TABS: 1.0000 | ORAL_TABLET | Freq: Two times a day (BID) | ORAL | 0 refills | Status: AC

## 2021-04-01 MED ORDER — FLUTICASONE PROPIONATE 50 MCG/ACT NA SUSP: 2.0000 | Freq: Every day | NASAL | 6 refills | Status: DC

## 2021-04-01 NOTE — Progress Notes (Signed)
° ° °  SUBJECTIVE:   CHIEF COMPLAINT / HPI:   Congestion rhinorrhea Patient presents with her daughter who reports that her mother has been having symptoms of congestion and rhinorrhea as well as head pain for 2 weeks.  She reports the pain is right in the front of her head as well as over her cheeks.  She denies any fever.  Reports increased mucus production as well as increased snot which is yellow in consistency.  Mild cough.  No known sick contacts.  She has been using over-the-counter Flonase but ran out approximately 1 week ago.  No other remedies.  Has not taken any Tylenol.   OBJECTIVE:   BP 108/64    Pulse 75    Ht 5\' 7"  (1.702 m)    Wt 165 lb 3.2 oz (74.9 kg)    LMP 10/19/2018 (Approximate)    SpO2 99%    BMI 25.87 kg/m   General: Pleasant 56 year old female HEENT: Erythema of nasal turbinates, mild erythema posterior oropharynx, tenderness to palpation on frontal sinuses Cardiac: Regular rate and rhythm Respiratory: Normal work of breathing, lungs clear to auscultation bilaterally Abdomen: Soft, nontender MSK: Ambulates without difficulty, no gross abnormalities   ASSESSMENT/PLAN:   Sinusitis Patient with 14-day history of rhinorrhea, congestion, tenderness to palpation over frontal sinuses.  Consistent with acute sinusitis.  Prescription sent for Flonase to patient's pharmacy.  Prescription sent for Augmentin to patient's pharmacy.  Strict ED and return precautions given.     59, MD [facility]

## 2021-04-01 NOTE — Patient Instructions (Signed)
It was great seeing you today!  I believe you have a sinus infection.  Because of how long it has been going on I am going to prescribe a medication called Augmentin.  I also refilled the Flonase and want you to take it regularly.  You will take this twice daily for 5 days.  I want you to take Tylenol for fever or discomfort.  Be sure to drink plenty of fluids.  If your symptoms worsen or do not improve please come back for further evaluation.  I hope you have a wonderful afternoon!

## 2021-04-02 DIAGNOSIS — J329 Chronic sinusitis, unspecified: Secondary | ICD-10-CM | POA: Insufficient documentation

## 2021-04-02 NOTE — Assessment & Plan Note (Signed)
Patient with 14-day history of rhinorrhea, congestion, tenderness to palpation over frontal sinuses.  Consistent with acute sinusitis.  Prescription sent for Flonase to patient's pharmacy.  Prescription sent for Augmentin to patient's pharmacy.  Strict ED and return precautions given.

## 2021-07-27 ENCOUNTER — Other Ambulatory Visit: Payer: Self-pay | Admitting: Neurological Surgery

## 2021-07-28 ENCOUNTER — Ambulatory Visit: Payer: Medicaid Other

## 2021-08-04 ENCOUNTER — Other Ambulatory Visit: Payer: Self-pay | Admitting: Neurological Surgery

## 2021-08-04 ENCOUNTER — Encounter (HOSPITAL_COMMUNITY): Admission: RE | Payer: Self-pay | Source: Home / Self Care

## 2021-08-04 ENCOUNTER — Ambulatory Visit (HOSPITAL_COMMUNITY): Admission: RE | Admit: 2021-08-04 | Payer: Medicaid Other | Source: Home / Self Care | Admitting: Neurological Surgery

## 2021-08-04 SURGERY — MINIMALLY INVASIVE (MIS) TRANSFORAMINAL LUMBAR INTERBODY FUSION (TLIF) 1 LEVEL
Anesthesia: General | Laterality: Left

## 2021-08-05 ENCOUNTER — Encounter (HOSPITAL_COMMUNITY): Payer: Self-pay | Admitting: Neurological Surgery

## 2021-08-05 ENCOUNTER — Other Ambulatory Visit: Payer: Self-pay

## 2021-08-05 NOTE — Progress Notes (Signed)
Spoke with pt's daughter, Adaw for pre-op call. Pt does not speak English, an Arabic interpreter has been requested for surgery on Monday. Adaw states pt does not have any cardiac history, HTN or Diabetes. Hx of glaucoma.  Gave Adaw shower instructions for her mother. She voiced understanding.

## 2021-08-08 ENCOUNTER — Ambulatory Visit (HOSPITAL_COMMUNITY): Payer: Medicaid Other | Admitting: General Practice

## 2021-08-08 ENCOUNTER — Encounter (HOSPITAL_COMMUNITY)
Admission: RE | Disposition: A | Discharge status: Home / Self Care | Payer: Self-pay | Source: Home / Self Care | Attending: Neurological Surgery

## 2021-08-08 ENCOUNTER — Observation Stay (HOSPITAL_COMMUNITY)
Admission: RE | Admit: 2021-08-08 | Discharge: 2021-08-09 | Disposition: A | Discharge status: Home / Self Care | Payer: Medicaid Other | Attending: Neurological Surgery | Admitting: Neurological Surgery

## 2021-08-08 ENCOUNTER — Ambulatory Visit (HOSPITAL_COMMUNITY): Payer: Medicaid Other

## 2021-08-08 ENCOUNTER — Other Ambulatory Visit: Payer: Self-pay

## 2021-08-08 ENCOUNTER — Encounter (HOSPITAL_COMMUNITY): Payer: Self-pay | Admitting: Neurological Surgery

## 2021-08-08 ENCOUNTER — Ambulatory Visit (HOSPITAL_BASED_OUTPATIENT_CLINIC_OR_DEPARTMENT_OTHER): Payer: Medicaid Other | Admitting: General Practice

## 2021-08-08 DIAGNOSIS — M5126 Other intervertebral disc displacement, lumbar region: Principal | ICD-10-CM | POA: Insufficient documentation

## 2021-08-08 DIAGNOSIS — M5416 Radiculopathy, lumbar region: Secondary | ICD-10-CM | POA: Diagnosis present

## 2021-08-08 DIAGNOSIS — Z8616 Personal history of COVID-19: Secondary | ICD-10-CM | POA: Insufficient documentation

## 2021-08-08 DIAGNOSIS — M5116 Intervertebral disc disorders with radiculopathy, lumbar region: Secondary | ICD-10-CM

## 2021-08-08 HISTORY — PX: TRANSFORAMINAL LUMBAR INTERBODY FUSION W/ MIS 1 LEVEL: SHX6145

## 2021-08-08 LAB — CBC
HCT: 36.6 % (ref 36.0–46.0)
Hemoglobin: 12.6 g/dL (ref 12.0–15.0)
MCH: 29.8 pg (ref 26.0–34.0)
MCHC: 34.4 g/dL (ref 30.0–36.0)
MCV: 86.5 fL (ref 80.0–100.0)
Platelets: 237 10*3/uL (ref 150–400)
RBC: 4.23 MIL/uL (ref 3.87–5.11)
RDW: 13.3 % (ref 11.5–15.5)
WBC: 3.7 10*3/uL — ABNORMAL LOW (ref 4.0–10.5)
nRBC: 0 % (ref 0.0–0.2)

## 2021-08-08 LAB — SURGICAL PCR SCREEN
MRSA, PCR: NEGATIVE
Staphylococcus aureus: POSITIVE — AB

## 2021-08-08 LAB — TYPE AND SCREEN
ABO/RH(D): A POS
Antibody Screen: NEGATIVE

## 2021-08-08 LAB — ABO/RH: ABO/RH(D): A POS

## 2021-08-08 IMAGING — RF DG LUMBAR SPINE 2-3V
1 series · 2 of 2 positions shown · non-contrast
Comparison: Lumbar spine radiographs [DATE]

CLINICAL DATA: L4-5 TLIF intraoperative fluoroscopy

EXAM:
LUMBAR SPINE - 2-3 VIEW

[Series 1: run · 2 of 2 slices shown]
[im 1/2]
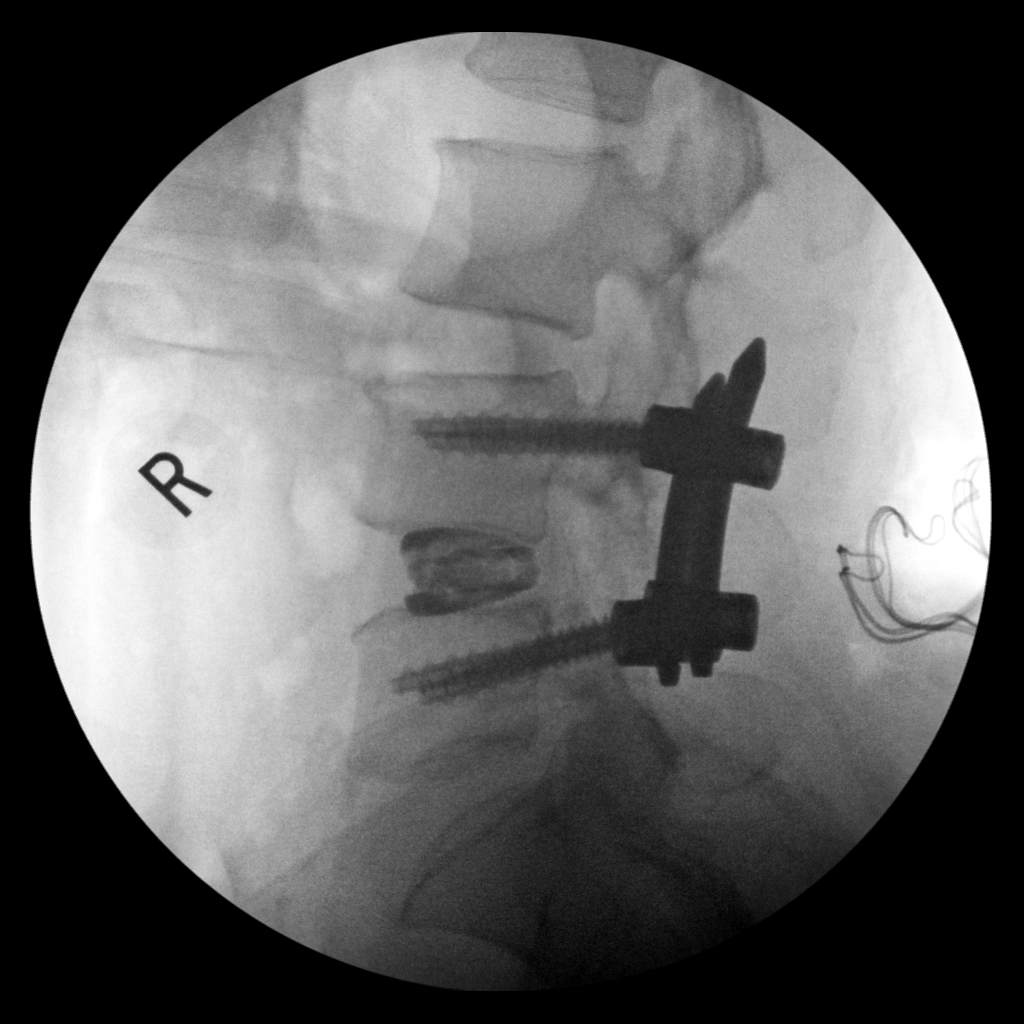
[im 2/2]
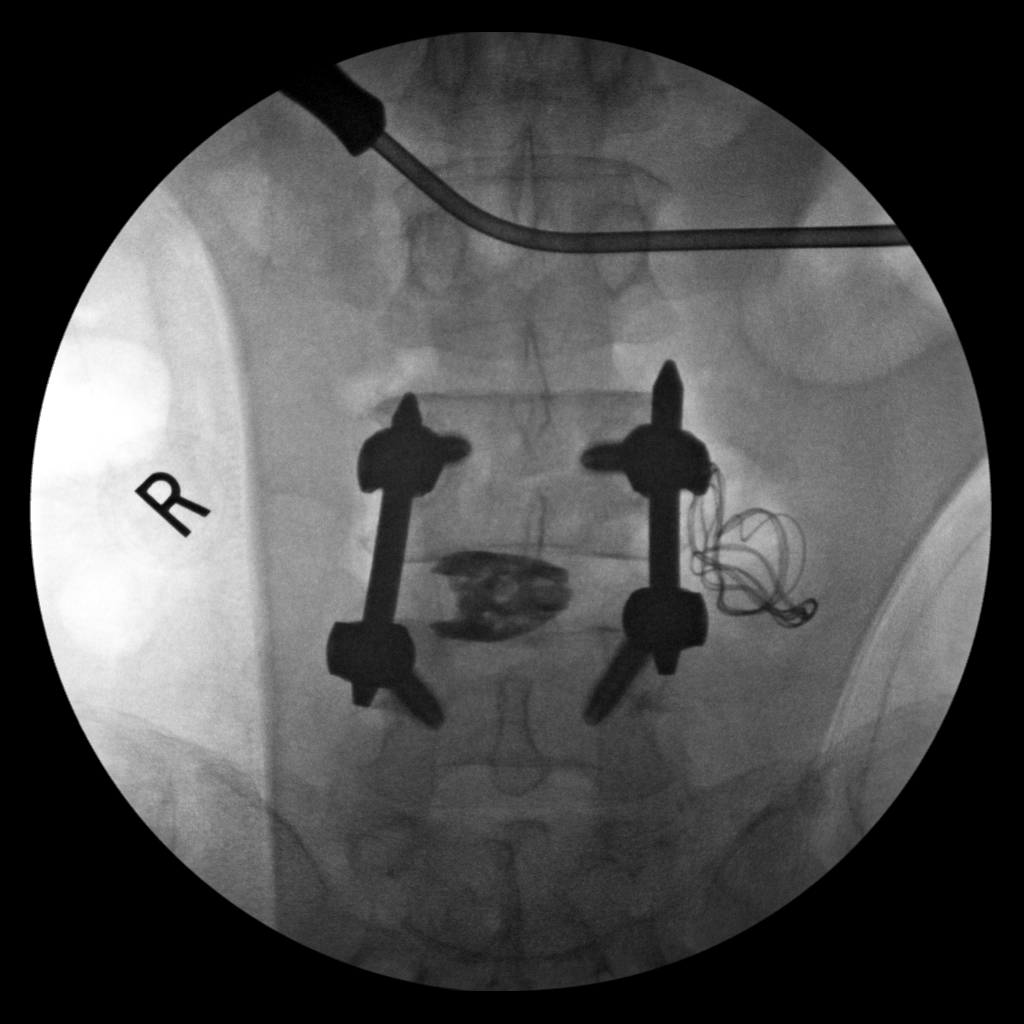

[2 of 2 positions shown; findings below may reference images not displayed]

FINDINGS: Images were performed intraoperatively without the presence of a
radiologist. Patient is undergoing L4-5 bilateral transpedicular rod
and screw fusion with associated intervertebral disc spacer.

Total fluoroscopy images: 2

Total fluoroscopy time: 114 seconds

Total dose: Radiation Exposure Index (as provided by the
fluoroscopic device): 63.06 mGy air Kerma

Please see intraoperative findings for further detail.
IMPRESSION: Intraoperative fluoroscopy for L4-5 posterior instrumented fusion.

## 2021-08-08 SURGERY — MINIMALLY INVASIVE (MIS) TRANSFORAMINAL LUMBAR INTERBODY FUSION (TLIF) 1 LEVEL
Anesthesia: General | Site: Spine Lumbar | Laterality: Left

## 2021-08-08 MED ORDER — CHLORHEXIDINE GLUCONATE CLOTH 2 % EX PADS: 6.0000 | MEDICATED_PAD | Freq: Once | CUTANEOUS | Status: DC

## 2021-08-08 MED ORDER — ROCURONIUM BROMIDE 10 MG/ML (PF) SYRINGE
PREFILLED_SYRINGE | INTRAVENOUS | Status: DC | PRN
  Administered 2021-08-08: 20 mg via INTRAVENOUS
  Administered 2021-08-08: 10 mg via INTRAVENOUS
  Administered 2021-08-08: 50 mg via INTRAVENOUS

## 2021-08-08 MED ORDER — OXYCODONE HCL 5 MG PO TABS
ORAL_TABLET | ORAL | Status: AC
  Filled 2021-08-08: qty 1

## 2021-08-08 MED ORDER — LIDOCAINE-EPINEPHRINE 1 %-1:100000 IJ SOLN
INTRAMUSCULAR | Status: AC
  Filled 2021-08-08: qty 1

## 2021-08-08 MED ORDER — CEFAZOLIN SODIUM-DEXTROSE 2-4 GM/100ML-% IV SOLN
2.0000 g | INTRAVENOUS | Status: AC
Start: 2021-08-08 — End: 2021-08-08
  Administered 2021-08-08: 2 g via INTRAVENOUS
  Filled 2021-08-08: qty 100

## 2021-08-08 MED ORDER — THROMBIN 5000 UNITS EX SOLR
CUTANEOUS | Status: AC
  Filled 2021-08-08: qty 5000

## 2021-08-08 MED ORDER — OXYCODONE HCL 5 MG/5ML PO SOLN: 5.0000 mg | Freq: Once | ORAL | Status: AC | PRN

## 2021-08-08 MED ORDER — OXYCODONE HCL 5 MG PO TABS
5.0000 mg | ORAL_TABLET | Freq: Once | ORAL | Status: AC | PRN
  Administered 2021-08-08: 5 mg via ORAL

## 2021-08-08 MED ORDER — FENTANYL CITRATE (PF) 250 MCG/5ML IJ SOLN
INTRAMUSCULAR | Status: AC
  Filled 2021-08-08: qty 5

## 2021-08-08 MED ORDER — PHENOL 1.4 % MT LIQD: 1.0000 | OROMUCOSAL | Status: DC | PRN

## 2021-08-08 MED ORDER — PROPOFOL 10 MG/ML IV BOLUS
INTRAVENOUS | Status: DC | PRN
  Administered 2021-08-08: 150 mg via INTRAVENOUS

## 2021-08-08 MED ORDER — ACETAMINOPHEN 650 MG RE SUPP: 650.0000 mg | RECTAL | Status: DC | PRN

## 2021-08-08 MED ORDER — FENTANYL CITRATE (PF) 100 MCG/2ML IJ SOLN
INTRAMUSCULAR | Status: AC
  Filled 2021-08-08: qty 2

## 2021-08-08 MED ORDER — LIDOCAINE 2% (20 MG/ML) 5 ML SYRINGE
INTRAMUSCULAR | Status: DC | PRN
  Administered 2021-08-08: 60 mg via INTRAVENOUS

## 2021-08-08 MED ORDER — 0.9 % SODIUM CHLORIDE (POUR BTL) OPTIME
TOPICAL | Status: DC | PRN
  Administered 2021-08-08: 1000 mL

## 2021-08-08 MED ORDER — ONDANSETRON HCL 4 MG/2ML IJ SOLN
INTRAMUSCULAR | Status: DC | PRN
  Administered 2021-08-08: 4 mg via INTRAVENOUS

## 2021-08-08 MED ORDER — FENTANYL CITRATE (PF) 100 MCG/2ML IJ SOLN
25.0000 ug | INTRAMUSCULAR | Status: DC | PRN
  Administered 2021-08-08 (×3): 50 ug via INTRAVENOUS

## 2021-08-08 MED ORDER — CHLORHEXIDINE GLUCONATE CLOTH 2 % EX PADS
6.0000 | MEDICATED_PAD | Freq: Once | CUTANEOUS | Status: DC
Start: 2021-08-08 — End: 2021-08-08

## 2021-08-08 MED ORDER — FENTANYL CITRATE (PF) 250 MCG/5ML IJ SOLN
INTRAMUSCULAR | Status: DC | PRN
  Administered 2021-08-08: 50 ug via INTRAVENOUS
  Administered 2021-08-08: 100 ug via INTRAVENOUS
  Administered 2021-08-08: 50 ug via INTRAVENOUS

## 2021-08-08 MED ORDER — DEXAMETHASONE SODIUM PHOSPHATE 10 MG/ML IJ SOLN
INTRAMUSCULAR | Status: DC | PRN
  Administered 2021-08-08: 10 mg via INTRAVENOUS

## 2021-08-08 MED ORDER — OXYCODONE HCL 5 MG PO TABS
10.0000 mg | ORAL_TABLET | ORAL | Status: DC | PRN
  Administered 2021-08-08 – 2021-08-09 (×4): 10 mg via ORAL
  Filled 2021-08-08 (×4): qty 2

## 2021-08-08 MED ORDER — CYCLOBENZAPRINE HCL 10 MG PO TABS
10.0000 mg | ORAL_TABLET | Freq: Three times a day (TID) | ORAL | Status: DC | PRN
  Administered 2021-08-08 – 2021-08-09 (×2): 10 mg via ORAL
  Filled 2021-08-08: qty 1

## 2021-08-08 MED ORDER — LIDOCAINE-EPINEPHRINE 1 %-1:100000 IJ SOLN
INTRAMUSCULAR | Status: DC | PRN
  Administered 2021-08-08: 10 mL

## 2021-08-08 MED ORDER — ONDANSETRON HCL 4 MG/2ML IJ SOLN
INTRAMUSCULAR | Status: AC
  Filled 2021-08-08: qty 2

## 2021-08-08 MED ORDER — ROCURONIUM BROMIDE 10 MG/ML (PF) SYRINGE
PREFILLED_SYRINGE | INTRAVENOUS | Status: AC
  Filled 2021-08-08: qty 10

## 2021-08-08 MED ORDER — OXYCODONE HCL 5 MG PO TABS: 5.0000 mg | ORAL_TABLET | ORAL | Status: DC | PRN

## 2021-08-08 MED ORDER — SODIUM CHLORIDE 0.9% FLUSH: 3.0000 mL | Freq: Two times a day (BID) | INTRAVENOUS | Status: DC

## 2021-08-08 MED ORDER — MIDAZOLAM HCL 2 MG/2ML IJ SOLN
INTRAMUSCULAR | Status: AC
  Filled 2021-08-08: qty 2

## 2021-08-08 MED ORDER — ACETAMINOPHEN 325 MG PO TABS
650.0000 mg | ORAL_TABLET | ORAL | Status: DC | PRN
  Administered 2021-08-08 – 2021-08-09 (×3): 650 mg via ORAL
  Filled 2021-08-08 (×3): qty 2

## 2021-08-08 MED ORDER — HYDROXYZINE HCL 50 MG/ML IM SOLN
50.0000 mg | Freq: Four times a day (QID) | INTRAMUSCULAR | Status: DC | PRN
  Administered 2021-08-08: 50 mg via INTRAMUSCULAR
  Filled 2021-08-08: qty 1

## 2021-08-08 MED ORDER — ONDANSETRON HCL 4 MG PO TABS: 4.0000 mg | ORAL_TABLET | Freq: Four times a day (QID) | ORAL | Status: DC | PRN

## 2021-08-08 MED ORDER — CEFAZOLIN SODIUM-DEXTROSE 2-4 GM/100ML-% IV SOLN
2.0000 g | Freq: Three times a day (TID) | INTRAVENOUS | Status: AC
  Administered 2021-08-08 – 2021-08-09 (×2): 2 g via INTRAVENOUS
  Filled 2021-08-08 (×2): qty 100

## 2021-08-08 MED ORDER — ONDANSETRON HCL 4 MG/2ML IJ SOLN: 4.0000 mg | Freq: Four times a day (QID) | INTRAMUSCULAR | Status: DC | PRN

## 2021-08-08 MED ORDER — POLYETHYLENE GLYCOL 3350 17 G PO PACK: 17.0000 g | PACK | Freq: Every day | ORAL | Status: DC | PRN

## 2021-08-08 MED ORDER — HYDROMORPHONE HCL 1 MG/ML IJ SOLN
1.0000 mg | INTRAMUSCULAR | Status: DC | PRN
  Administered 2021-08-08: 1 mg via INTRAVENOUS
  Filled 2021-08-08: qty 1

## 2021-08-08 MED ORDER — MIDAZOLAM HCL 2 MG/2ML IJ SOLN
INTRAMUSCULAR | Status: DC | PRN
  Administered 2021-08-08: 2 mg via INTRAVENOUS

## 2021-08-08 MED ORDER — LACTATED RINGERS IV SOLN: INTRAVENOUS | Status: DC

## 2021-08-08 MED ORDER — DEXAMETHASONE SODIUM PHOSPHATE 10 MG/ML IJ SOLN
INTRAMUSCULAR | Status: AC
  Filled 2021-08-08: qty 1

## 2021-08-08 MED ORDER — THROMBIN 5000 UNITS EX SOLR: OROMUCOSAL | Status: DC | PRN

## 2021-08-08 MED ORDER — ACETAMINOPHEN 500 MG PO TABS
1000.0000 mg | ORAL_TABLET | Freq: Once | ORAL | Status: AC
  Administered 2021-08-08: 1000 mg via ORAL
  Filled 2021-08-08: qty 2

## 2021-08-08 MED ORDER — ORAL CARE MOUTH RINSE: 15.0000 mL | Freq: Once | OROMUCOSAL | Status: AC

## 2021-08-08 MED ORDER — DOCUSATE SODIUM 100 MG PO CAPS
100.0000 mg | ORAL_CAPSULE | Freq: Two times a day (BID) | ORAL | Status: DC
  Administered 2021-08-08 – 2021-08-09 (×2): 100 mg via ORAL
  Filled 2021-08-08 (×2): qty 1

## 2021-08-08 MED ORDER — SUGAMMADEX SODIUM 200 MG/2ML IV SOLN
INTRAVENOUS | Status: DC | PRN
Start: 2021-08-08 — End: 2021-08-08
  Administered 2021-08-08: 200 mg via INTRAVENOUS

## 2021-08-08 MED ORDER — LIDOCAINE 2% (20 MG/ML) 5 ML SYRINGE
INTRAMUSCULAR | Status: AC
  Filled 2021-08-08: qty 5

## 2021-08-08 MED ORDER — CHLORHEXIDINE GLUCONATE 0.12 % MT SOLN
15.0000 mL | Freq: Once | OROMUCOSAL | Status: AC
  Administered 2021-08-08: 15 mL via OROMUCOSAL
  Filled 2021-08-08: qty 15

## 2021-08-08 MED ORDER — ONDANSETRON HCL 4 MG/2ML IJ SOLN
4.0000 mg | Freq: Once | INTRAMUSCULAR | Status: AC | PRN
  Administered 2021-08-08: 4 mg via INTRAVENOUS

## 2021-08-08 MED ORDER — MENTHOL 3 MG MT LOZG: 1.0000 | LOZENGE | OROMUCOSAL | Status: DC | PRN

## 2021-08-08 MED ORDER — CELECOXIB 200 MG PO CAPS
200.0000 mg | ORAL_CAPSULE | Freq: Once | ORAL | Status: AC
  Administered 2021-08-08: 200 mg via ORAL
  Filled 2021-08-08: qty 1

## 2021-08-08 MED ORDER — SODIUM CHLORIDE 0.9 % IV SOLN: 250.0000 mL | INTRAVENOUS | Status: DC

## 2021-08-08 MED ORDER — PHENYLEPHRINE HCL-NACL 20-0.9 MG/250ML-% IV SOLN
INTRAVENOUS | Status: DC | PRN
  Administered 2021-08-08: 30 ug/min via INTRAVENOUS

## 2021-08-08 MED ORDER — SODIUM CHLORIDE 0.9% FLUSH: 3.0000 mL | INTRAVENOUS | Status: DC | PRN

## 2021-08-08 MED ORDER — CYCLOBENZAPRINE HCL 10 MG PO TABS
ORAL_TABLET | ORAL | Status: AC
  Filled 2021-08-08: qty 1

## 2021-08-08 SURGICAL SUPPLY — 56 items
BAG COUNTER SPONGE SURGICOUNT (BAG) ×2 IMPLANT
BAND RUBBER #18 3X1/16 STRL (MISCELLANEOUS) ×4 IMPLANT
BASKET BONE COLLECTION (BASKET) ×2 IMPLANT
BLADE SURG 11 STRL SS (BLADE) ×2 IMPLANT
BUR MATCHSTICK NEURO 3.0 LAGG (BURR) IMPLANT
BUR ROUND FLUTED 4 SOFT TCH (BURR) ×1 IMPLANT
BUR ROUND PRECISION 4.0 (BURR) ×1 IMPLANT
CAGE EXP CATALYFT 9 (Plate) ×1 IMPLANT
CNTNR URN SCR LID CUP LEK RST (MISCELLANEOUS) ×1 IMPLANT
CONT SPEC 4OZ STRL OR WHT (MISCELLANEOUS) ×1
COVER BACK TABLE 60X90IN (DRAPES) ×2 IMPLANT
DERMABOND ADVANCED (GAUZE/BANDAGES/DRESSINGS) ×1
DERMABOND ADVANCED .7 DNX12 (GAUZE/BANDAGES/DRESSINGS) ×1 IMPLANT
DRAPE C-ARM 42X72 X-RAY (DRAPES) ×2 IMPLANT
DRAPE C-ARMOR (DRAPES) ×2 IMPLANT
DRAPE LAPAROTOMY 100X72X124 (DRAPES) ×2 IMPLANT
DRAPE MICROSCOPE LEICA (MISCELLANEOUS) ×2 IMPLANT
DRAPE SURG 17X23 STRL (DRAPES) ×4 IMPLANT
ELECT BLADE 6.5 EXT (BLADE) ×2 IMPLANT
ELECT REM PT RETURN 9FT ADLT (ELECTROSURGICAL) ×2
ELECTRODE REM PT RTRN 9FT ADLT (ELECTROSURGICAL) ×1 IMPLANT
EXTENDER TAB GUIDE SV 5.5/6.0 (INSTRUMENTS) ×8 IMPLANT
GAUZE 4X4 16PLY ~~LOC~~+RFID DBL (SPONGE) IMPLANT
GAUZE SPONGE 4X4 12PLY STRL (GAUZE/BANDAGES/DRESSINGS) ×2 IMPLANT
GLOVE BIOGEL PI IND STRL 7.5 (GLOVE) ×1 IMPLANT
GLOVE BIOGEL PI INDICATOR 7.5 (GLOVE) ×1
GLOVE ECLIPSE 7.5 STRL STRAW (GLOVE) ×2 IMPLANT
GOWN STRL REUS W/ TWL LRG LVL3 (GOWN DISPOSABLE) ×1 IMPLANT
GOWN STRL REUS W/ TWL XL LVL3 (GOWN DISPOSABLE) IMPLANT
GOWN STRL REUS W/TWL 2XL LVL3 (GOWN DISPOSABLE) IMPLANT
GOWN STRL REUS W/TWL LRG LVL3 (GOWN DISPOSABLE) ×1
GOWN STRL REUS W/TWL XL LVL3 (GOWN DISPOSABLE)
GUIDEWIRE BLUNT NT 450 (WIRE) ×4 IMPLANT
HEMOSTAT POWDER KIT SURGIFOAM (HEMOSTASIS) ×2 IMPLANT
KIT BASIN OR (CUSTOM PROCEDURE TRAY) ×2 IMPLANT
KIT POSITION SURG JACKSON T1 (MISCELLANEOUS) ×2 IMPLANT
KIT TURNOVER KIT B (KITS) ×1 IMPLANT
NDL BEVEL TWO-PAK W/1PK (NEEDLE) IMPLANT
NDL SPNL 18GX3.5 QUINCKE PK (NEEDLE) IMPLANT
NEEDLE BEVEL TWO-PAK W/1PK (NEEDLE) ×2 IMPLANT
NEEDLE HYPO 22GX1.5 SAFETY (NEEDLE) ×2 IMPLANT
NEEDLE SPNL 18GX3.5 QUINCKE PK (NEEDLE) IMPLANT
NS IRRIG 1000ML POUR BTL (IV SOLUTION) ×2 IMPLANT
PACK LAMINECTOMY NEURO (CUSTOM PROCEDURE TRAY) ×2 IMPLANT
PUTTY DBM GRAFTON 5CC (Putty) ×1 IMPLANT
ROD 5.5X45MM SOLERA VOYAGER (Rod) ×2 IMPLANT
SCREW MAS VOYAGER 6.5X40 (Screw) ×4 IMPLANT
SCREW SET 5.5/6.0MM SOLERA (Screw) ×4 IMPLANT
SUT MNCRL AB 3-0 PS2 18 (SUTURE) ×2 IMPLANT
SUT VIC AB 0 CT1 18XCR BRD8 (SUTURE) IMPLANT
SUT VIC AB 0 CT1 8-18 (SUTURE) ×1
SUT VIC AB 2-0 CP2 18 (SUTURE) ×2 IMPLANT
TOWEL GREEN STERILE (TOWEL DISPOSABLE) ×2 IMPLANT
TOWEL GREEN STERILE FF (TOWEL DISPOSABLE) ×2 IMPLANT
TRAY FOLEY MTR SLVR 16FR STAT (SET/KITS/TRAYS/PACK) IMPLANT
WATER STERILE IRR 1000ML POUR (IV SOLUTION) ×2 IMPLANT

## 2021-08-08 NOTE — Transfer of Care (Signed)
Immediate Anesthesia Transfer of Care Note  Patient: Carrie Rivas  Procedure(s) Performed: LEFT LUMBAR FOUR-FIVE MINIMALLY INVASIVE (MIS) TRANSFORAMINAL LUMBAR INTERBODY FUSION (Left: Spine Lumbar)  Patient Location: PACU  Anesthesia Type:General  Level of Consciousness: awake  Airway & Oxygen Therapy: Patient Spontanous Breathing  Post-op Assessment: Report given to RN  Post vital signs: Reviewed and stable  Last Vitals:  Vitals Value Taken Time  BP 132/88 08/08/21 1733  Temp    Pulse 86 08/08/21 1736  Resp 14 08/08/21 1736  SpO2 100 % 08/08/21 1736  Vitals shown include unvalidated device data.  Last Pain:  Vitals:   08/08/21 1225  TempSrc:   PainSc: 5       Patients Stated Pain Goal: 0 (08/08/21 1225)  Complications: No notable events documented.

## 2021-08-08 NOTE — H&P (Signed)
Surgical H&P Update  HPI: 56 y.o. woman with a history of recurrent disc herniation s/p MIS discectomy x2 with a third recurrence, now here for MIS TLIF. No changes in health since they were last seen. Still having back and leg pain and wishes to proceed with surgery.  PMHx:  Past Medical History:  Diagnosis Date   COVID 2020   mild case   Glaucoma    Right flank pain    Vision loss of right eye    FamHx: History reviewed. No pertinent family history. SocHx:  reports that she has never smoked. She has never used smokeless tobacco. She reports that she does not drink alcohol and does not use drugs.  Physical Exam: Strength 5/5 x4 and SILTx4 except 4/5 weakness in L PF / TA / EHL / inversion, intact on R and L eversion, SILT except L L5 numbness  Assesment/Plan: 56 y.o. woman with recurrent HNP x3, here for L L4-5 MIS TLIF. Risks, benefits, and alternatives discussed and the patient would like to continue with surgery.  -OR today -3C post-op  Jadene Pierini, MD 08/08/21 1:39 PM

## 2021-08-08 NOTE — Anesthesia Preprocedure Evaluation (Addendum)
Anesthesia Evaluation  Patient identified by MRN, date of birth, ID band Patient awake    Reviewed: Allergy & Precautions, Patient's Chart, lab work & pertinent test results  History of Anesthesia Complications Negative for: history of anesthetic complications  Airway Mallampati: II  TM Distance: >3 FB Neck ROM: Full    Dental no notable dental hx.    Pulmonary neg pulmonary ROS,    Pulmonary exam normal        Cardiovascular negative cardio ROS   Rhythm:Regular Rate:Normal     Neuro/Psych  Right eye visual loss  negative psych ROS   GI/Hepatic negative GI ROS, (+) Hepatitis -, B  Endo/Other  negative endocrine ROS  Renal/GU negative Renal ROS     Musculoskeletal negative musculoskeletal ROS (+)   Abdominal Normal abdominal exam  (+)   Peds  Hematology negative hematology ROS (+)   Anesthesia Other Findings   Reproductive/Obstetrics                            Anesthesia Physical Anesthesia Plan  ASA: 2  Anesthesia Plan: General   Post-op Pain Management: Tylenol PO (pre-op)* and Celebrex PO (pre-op)*   Induction: Intravenous  PONV Risk Score and Plan: 3 and Treatment may vary due to age or medical condition, Ondansetron, Dexamethasone and Midazolam  Airway Management Planned: Oral ETT  Additional Equipment: None  Intra-op Plan:   Post-operative Plan: Extubation in OR  Informed Consent: I have reviewed the patients History and Physical, chart, labs and discussed the procedure including the risks, benefits and alternatives for the proposed anesthesia with the patient or authorized representative who has indicated his/her understanding and acceptance.     Dental advisory given  Plan Discussed with: CRNA and Anesthesiologist  Anesthesia Plan Comments:        Anesthesia Quick Evaluation

## 2021-08-08 NOTE — Op Note (Signed)
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 It recurred post-op, at which time I performed a repeat discectomy. She again had resolution of her symptoms post-op and they then recurred yet again with MRI showing a large recurrent disc herniation. Given the 3 disc herniations at this level, I recommended repeat treatment in the form of an MIS TLIF to allow for a complete discectomy to remove the recurrent disc material and prevent recurrence. This was discussed with the patient as well as risks, benefits, and alternatives and wished to proceed with surgery.   OPERATIVE DETAIL:  The patient was taken to the operating room and placed on the OR table in the prone position. A formal time out was performed with two patient identifiers and confirmed the operative site. Anesthesia was induced by the anesthesia team. The operative site was marked, hair was clipped with surgical clippers, the area was then prepped and draped in a sterile fashion.   Fluoroscopy was used to localize the surgical level. The pedicles were marked and used to create skin incisions bilaterally. With fluoro guidance, Jamshidi needles were used to guide K-wires into the bilateral L4 and L5 pedicles. The K wires were then secured with hemostats and attention turned to the TLIF.  A [employer] tube was then docked to the left L4-5 facet through the same incision using fluoroscopy. This was docked on the lateral aspect of the facet with great care due to the known laminar defect medially from her prior discectomies, which resulted in a more  lateral trajectory than usual to avoid dilators blindly approaching that defect. A left L4-5 facetectomy was performed and the left L4 nerve root was decompressed along its entire course. The tube was wanded medially and the decompression was continued medially until reaching midline. This was made more difficult by extensive scar tissue that extended up under the pars of L4-5 and across the entire disc space. There was a large paracentral recurrent disc herniation with extensive associated scar tissue, which was excised. A defect in the disc space was created and disc material was dissected free and reduced into the disc space to push it away from the thecal sac. The scar tissue was tracked dorsally to also removing the inferiorly migrated fragment. With decompression complete, the endplates were prepped, bone graft was packed into the disc space, and an expandable cage ([employer]) was packed with autograft and placed into the disc space with fluoroscopic confirmation. The tube was removed and hemostasis was obtained during its removal.   Using the previously placed K wires, a tap and then screw with tower were placed bilaterally at L4 and L5. A rod was sized and introduced on both sides, confirmed with fluoroscopy, then final tightened. Hemostasis was again confirmed for both incisions, they were copiously irrigated, and then closed in layers.    EBL:  182mL   DRAINS: none   SPECIMENS: none   Judith Part, MD 08/08/21 2:02 PM

## 2021-08-08 NOTE — Anesthesia Procedure Notes (Signed)
Procedure Name: Intubation Date/Time: 08/08/2021 2:07 PM Performed by: Dorann Lodge, CRNA Pre-anesthesia Checklist: Patient identified, Emergency Drugs available, Suction available and Patient being monitored Patient Re-evaluated:Patient Re-evaluated prior to induction Oxygen Delivery Method: Circle System Utilized Preoxygenation: Pre-oxygenation with 100% oxygen Induction Type: IV induction Ventilation: Mask ventilation without difficulty Laryngoscope Size: Mac and 3 Grade View: Grade I Tube type: Oral Tube size: 7.0 mm Number of attempts: 1 Airway Equipment and Method: Stylet Placement Confirmation: ETT inserted through vocal cords under direct vision, positive ETCO2 and breath sounds checked- equal and bilateral Secured at: 22 cm Tube secured with: Tape Dental Injury: Teeth and Oropharynx as per pre-operative assessment

## 2021-08-09 DIAGNOSIS — M5126 Other intervertebral disc displacement, lumbar region: Secondary | ICD-10-CM | POA: Diagnosis not present

## 2021-08-09 MED ORDER — CYCLOBENZAPRINE HCL 10 MG PO TABS: 10.0000 mg | ORAL_TABLET | Freq: Three times a day (TID) | ORAL | 0 refills | Status: AC | PRN

## 2021-08-09 MED ORDER — IBUPROFEN 200 MG PO TABS: 200.0000 mg | ORAL_TABLET | Freq: Four times a day (QID) | ORAL | 0 refills | Status: AC | PRN

## 2021-08-09 MED ORDER — OXYCODONE HCL 5 MG PO TABS: 5.0000 mg | ORAL_TABLET | ORAL | 0 refills | Status: AC | PRN

## 2021-08-09 NOTE — Discharge Summary (Signed)
Discharge Summary  Date of Admission: 08/08/2021  Date of Discharge: 08/09/21  Attending Physician: Autumn Patty, MD  Hospital Course: Patient was admitted following an uncomplicated L4-5 MIS TLIF. They were recovered in PACU and transferred to [facility]. Their preop symptoms were dramatically improved post-op, their hospital course was uncomplicated and the patient was discharged home on 08/09/21. They will follow up in clinic with me in clinic in 2 weeks.  Neurologic exam at discharge:  Strength 5/5 x4 and SILTx4   Discharge diagnosis: Lumbar radiculopathy  Jadene Pierini, MD 08/09/21 10:11 AM

## 2021-08-09 NOTE — Anesthesia Postprocedure Evaluation (Signed)
Anesthesia Post Note  Patient: Carrie Rivas  Procedure(s) Performed: LEFT LUMBAR FOUR-FIVE MINIMALLY INVASIVE (MIS) TRANSFORAMINAL LUMBAR INTERBODY FUSION (Left: Spine Lumbar)     Patient location during evaluation: PACU Anesthesia Type: General Level of consciousness: awake and alert Pain management: pain level controlled Vital Signs Assessment: post-procedure vital signs reviewed and stable Respiratory status: spontaneous breathing, nonlabored ventilation, respiratory function stable and patient connected to nasal cannula oxygen Cardiovascular status: blood pressure returned to baseline and stable Postop Assessment: no apparent nausea or vomiting Anesthetic complications: no   No notable events documented.  Last Vitals:  Vitals:   08/09/21 0302 08/09/21 0726  BP: 116/77 128/86  Pulse: 84 72  Resp: 18 16  Temp: 36.7 C 36.8 C  SpO2: 100% 98%    Last Pain:  Vitals:   08/09/21 0724  TempSrc:   PainSc: 2                  [name]

## 2021-08-09 NOTE — Evaluation (Signed)
Physical Therapy Evaluation & Discharge Patient Details Name: Carrie Rivas MRN: [id number] DOB: 22-Feb-1966 Today's Date: 08/09/2021  History of Present Illness  Pt is a 56 y.o. female s/p elective L L4-5 minimally invasive TLIF. PMH includes glaucoma, R eye vision loss, minimally invasive discectomy (2021).   Clinical Impression  Patient evaluated by Physical Therapy with no further acute PT needs identified. PTA, pt independent without DME, lives with daughters who work. Today, pt moving well despite c/o pain. Pt performed transfer and gait training with and without RW, but prefers stability of RW. Educ pt and daughter re: back precautions, positioning, activity recommendations. All education has been completed and the patient has no further questions. Acute PT is signing off. Thank you for this referral.     Recommendations for follow up therapy are one component of a multi-disciplinary discharge planning process, led by the attending physician.  Recommendations may be updated based on patient status, additional functional criteria and insurance authorization.  Follow Up Recommendations No PT follow up    Assistance Recommended at Discharge Intermittent Supervision/Assistance  Patient can return home with the following  Assistance with cooking/housework;Assist for transportation    Equipment Recommendations Rolling walker (2 wheels)  Recommendations for Other Services       Functional Status Assessment       Precautions / Restrictions Precautions Precautions: Back Precaution Booklet Issued: Yes (comment) Restrictions Weight Bearing Restrictions: No      Mobility  Bed Mobility Overal bed mobility: Modified Independent             General bed mobility comments: received sidelying, able to come to sitting EOB without assist, increased time and effort; HOB slightly elevated; reviewed log roll technique    Transfers Overall transfer level: Needs assistance Equipment  used: Rolling walker (2 wheels) Transfers: Sit to/from Stand Sit to Stand: Supervision           General transfer comment: cues for hand placement, increased time and effort secondary to pain; pt still opting to pull with BUEs on RW "it's easier"    Ambulation/Gait Ambulation/Gait assistance: Supervision Gait Distance (Feet): 400 Feet Assistive device: Rolling walker (2 wheels), None Gait Pattern/deviations: Step-through pattern, Decreased stride length, Antalgic Gait velocity: Decreased     General Gait Details: slow, guarded gait with RW and supervision for safety; additional gait trial without DME, initial min guard progressing to supervision  Stairs Stairs:  (pt declined; reports no stairs at home)          Wheelchair Mobility    Modified Rankin (Stroke Patients Only)       Balance Overall balance assessment: Needs assistance   Sitting balance-Leahy Scale: Good Sitting balance - Comments: able to cross bilateral legs in order to reach feet, guarded with pain     Standing balance-Leahy Scale: Fair Standing balance comment: can static stand and ambulate without DME                             Pertinent Vitals/Pain Pain Assessment Pain Assessment: Faces Faces Pain Scale: Hurts little more Pain Location: back Pain Descriptors / Indicators: Discomfort, Grimacing, Guarding Pain Intervention(s): Monitored during session, Limited activity within patient's tolerance    Home Living Family/patient expects to be discharged to:: Private residence Living Arrangements: Children Available Help at Discharge: Family;Available PRN/intermittently Type of Home: Apartment Home Access: Level entry       Home Layout: One level Home Equipment: Grab bars - tub/shower Additional  Comments: lives with daughters who work    Prior Function Prior Level of Function : Independent/Modified Independent             Mobility Comments: Independent without DME; does  not work       Higher education careers adviser        Extremity/Trunk Assessment   Upper Extremity Assessment Upper Extremity Assessment: Overall WFL for tasks assessed    Lower Extremity Assessment Lower Extremity Assessment: Overall WFL for tasks assessed (endorses decreased light touch sensation L lower leg)    Cervical / Trunk Assessment Cervical / Trunk Assessment: Back Surgery  Communication   Communication: No difficulties;Prefers language other than English  Cognition Arousal/Alertness: Awake/alert Behavior During Therapy: WFL for tasks assessed/performed, Flat affect Overall Cognitive Status: Within Functional Limits for tasks assessed                                 General Comments: WFL for simple tasks via basic English; pleasant and agreeable        General Comments General comments (skin integrity, edema, etc.): pt's daughter present and supportive    Exercises     Assessment/Plan    PT Assessment Patient does not need any further PT services  PT Problem List         PT Treatment Interventions      PT Goals (Current goals can be found in the Care Plan section)  Acute Rehab PT Goals PT Goal Formulation: All assessment and education complete, DC therapy    Frequency       Co-evaluation               AM-PAC PT "6 Clicks" Mobility  Outcome Measure Help needed turning from your back to your side while in a flat bed without using bedrails?: None Help needed moving from lying on your back to sitting on the side of a flat bed without using bedrails?: None Help needed moving to and from a bed to a chair (including a wheelchair)?: A Little Help needed standing up from a chair using your arms (e.g., wheelchair or bedside chair)?: A Little Help needed to walk in hospital room?: A Little Help needed climbing 3-5 steps with a railing? : A Little 6 Click Score: 20    End of Session   Activity Tolerance: Patient tolerated treatment well Patient  left: in bed;with call bell/phone within reach;with family/visitor present Nurse Communication: Mobility status PT Visit Diagnosis: Other abnormalities of gait and mobility (R26.89);Pain    Time: 5638-7564 PT Time Calculation (min) (ACUTE ONLY): 20 min   Charges:   PT Evaluation $PT Eval Low Complexity: 1 Low        Ina Homes, PT, DPT Acute Rehabilitation Services  Pager [phone] Office [phone]  Malachy Chamber 08/09/2021, 9:34 AM

## 2021-08-09 NOTE — TOC Transition Note (Signed)
Transition of Care [facility]) - CM/SW Discharge Note   Patient Details  Name: Carrie Rivas MRN: [id number] Date of Birth: [birth year]  [facility] CM/SW Contact:  Kermit Balo, RN Phone Number: 08/09/2021, 10:15 AM   Clinical Narrative:    Patient discharging home with self care. No f/u per PT. Walker to be obtain per the unit if pt needs for home.  Pt has transport home.    Final next level of care: Home/Self Care Barriers to Discharge: No Barriers Identified   Patient Goals and CMS Choice        Discharge Placement                       Discharge Plan and Services                                     Social Determinants of Health (SDOH) Interventions     Readmission Risk Interventions     View : No data to display.

## 2021-08-09 NOTE — Progress Notes (Signed)
Neurosurgery Service Progress Note  Subjective: No acute events overnight, minimal radicular pain, dramatically improved from preop, weakness/numbness improved   Objective: Vitals:   08/08/21 1934 08/08/21 1955 08/09/21 0302 08/09/21 0726  BP: 130/84 139/90 116/77 128/86  Pulse: 74 71 84 72  Resp: 15 16 18 16   Temp: 97.6 F (36.4 C)  98 F (36.7 C) 98.2 F (36.8 C)  TempSrc:   Oral   SpO2: 99% 100% 100% 98%  Weight:      Height:        Physical Exam: Strength 5/5 x4 and SILTx4   Assessment & Plan: 56 y.o. woman s/p L4-5 MIS TLIF, recovering well.  -discharge home today  59  08/09/21 10:11 AM

## 2021-08-09 NOTE — Plan of Care (Signed)

## 2021-08-09 NOTE — Progress Notes (Signed)
Patient awaiting to be transported to her vehicle for discharge home; in no acute distress nor complaints of pain nor discomfort; moves all  extremities well; incision on her back with skin glue and is clean, dry and intact; room was checked for all belongings and were taken along by daughter on the way to her vehicle; discharge instructions concerning her medications, incision care, follow up appointment and when to call the doctor as needed were all discussed to patient's daughter by RN and she verbalized understanding on the instructions given.

## 2021-08-09 NOTE — Evaluation (Signed)
Occupational Therapy Evaluation Patient Details Name: Carrie Rivas MRN: [id number] DOB: [birth year] Today's Date: 08/09/2021   History of Present Illness Pt is a 56 y.o. female s/p elective L L4-5 minimally invasive TLIF. PMH includes glaucoma, R eye vision loss, minimally invasive discectomy (2021).   Clinical Impression   Pt admitted for above and limited by problem list below. Educated on back precautions, ADL compensatory techniques, AE/DME recommendations and safety.  Patient requires min cueing for safety with RW, min cueing for techniques during ADLs.  Able to complete ADLs with min guard to supervision. Will have support of daughter as needed at home.  Daughter interpreting during session as needed.  Provided handout and utilize pictures as able for education.  Pt with no questions or concerns.  OT will sign off with no further needs required.      Recommendations for follow up therapy are one component of a multi-disciplinary discharge planning process, led by the attending physician.  Recommendations may be updated based on patient status, additional functional criteria and insurance authorization.   Follow Up Recommendations  No OT follow up    Assistance Recommended at Discharge Intermittent Supervision/Assistance  Patient can return home with the following A little help with bathing/dressing/bathroom;Assistance with cooking/housework    Functional Status Assessment     Equipment Recommendations  None recommended by OT    Recommendations for Other Services       Precautions / Restrictions Precautions Precautions: Back Precaution Booklet Issued: Yes (comment) Precaution Comments: reviewed with pt and daughter Restrictions Weight Bearing Restrictions: No      Mobility Bed Mobility Overal bed mobility: Modified Independent                  Transfers                          Balance Overall balance assessment: Needs assistance   Sitting  balance-Leahy Scale: Good     Standing balance support: No upper extremity supported, During functional activity, Bilateral upper extremity supported Standing balance-Leahy Scale: Fair                             ADL either performed or assessed with clinical judgement   ADL Overall ADL's : Needs assistance/impaired     Grooming: Supervision/safety;Standing       Lower Body Bathing: Min guard;Sit to/from stand Lower Body Bathing Details (indicate cue type and reason): grabbars, cueing for compensatory techniques and educated on use of long handed sponge Upper Body Dressing : Set up;Sitting   Lower Body Dressing: Supervision/safety;Sit to/from stand;Cueing for back precautions   Toilet Transfer: Supervision/safety;Ambulation;Rolling walker (2 wheels) Toilet Transfer Details (indicate cue type and reason): reports able to push up from sink/grabbar at home Toileting- Clothing Manipulation and Hygiene: Supervision/safety;Sit to/from stand Toileting - Clothing Manipulation Details (indicate cue type and reason): reviewed compensatory techniques via demonstration and handout     Functional mobility during ADLs: Supervision/safety;Rolling walker (2 wheels) General ADL Comments: pt educated on compensatory techniques and back precautions     Vision   Additional Comments: functional for tasks assessed     Perception     Praxis      Pertinent Vitals/Pain Pain Assessment Pain Assessment: Faces Faces Pain Scale: Hurts little more Pain Location: back Pain Descriptors / Indicators: Discomfort, Grimacing, Guarding Pain Intervention(s): Limited activity within patient's tolerance, Monitored during session, Repositioned     Hand Dominance  Right   Extremity/Trunk Assessment Upper Extremity Assessment Upper Extremity Assessment: Overall WFL for tasks assessed   Lower Extremity Assessment Lower Extremity Assessment: Defer to PT evaluation   Cervical / Trunk  Assessment Cervical / Trunk Assessment: Back Surgery   Communication Communication Communication: No difficulties;Prefers language other than [place] (daughter present and translating)   Cognition Arousal/Alertness: Awake/alert Behavior During Therapy: WFL for tasks assessed/performed, Flat affect Overall Cognitive Status: Within Functional Limits for tasks assessed                                 General Comments: WFL for simple tasks via basic English; pleasant and agreeable     General Comments  daughter present and supportive    Exercises     Shoulder Instructions      Home Living Family/patient expects to be discharged to:: Private residence Living Arrangements: Children Available Help at Discharge: Family;Available PRN/intermittently Type of Home: Apartment Home Access: Level entry     Home Layout: One level     Bathroom Shower/Tub: Producer, television/film/video: Standard     Home Equipment: Grab bars - tub/shower   Additional Comments: lives with daughters who work      Prior Functioning/Environment Prior Level of Function : Independent/Modified Independent             Mobility Comments: Independent          OT Problem List: Decreased activity tolerance;Decreased knowledge of use of DME or AE;Decreased knowledge of precautions;Pain      OT Treatment/Interventions:      OT Goals(Current goals can be found in the care plan section) Acute Rehab OT Goals Patient Stated Goal: not stated  OT Frequency:      Co-evaluation              AM-PAC OT "6 Clicks" Daily Activity     Outcome Measure Help from another person eating meals?: None Help from another person taking care of personal grooming?: A Little Help from another person toileting, which includes using toliet, bedpan, or urinal?: A Little Help from another person bathing (including washing, rinsing, drying)?: A Little Help from another person to put on and taking off  regular upper body clothing?: None Help from another person to put on and taking off regular lower body clothing?: A Little 6 Click Score: 20   End of Session Equipment Utilized During Treatment: Rolling walker (2 wheels) Nurse Communication: Mobility status  Activity Tolerance: Patient tolerated treatment well Patient left: in bed;with call bell/phone within reach;with family/visitor present  OT Visit Diagnosis: Other abnormalities of gait and mobility (R26.89);Pain Pain - part of body:  (back)                Time: 1017-5102 OT Time Calculation (min): 12 min Charges:  OT General Charges $OT Visit: 1 Visit OT Evaluation $OT Eval Low Complexity: 1 Low  Barry Brunner, OT Acute Rehabilitation Services Pager [phone] Office [phone]   Chancy Milroy 08/09/2021, 10:42 AM

## 2021-08-10 ENCOUNTER — Encounter (HOSPITAL_COMMUNITY): Payer: Self-pay | Admitting: Neurological Surgery

## 2022-01-04 ENCOUNTER — Ambulatory Visit: Payer: Medicaid Other

## 2022-01-04 DIAGNOSIS — Z23 Encounter for immunization: Secondary | ICD-10-CM

## 2023-08-27 NOTE — Progress Notes (Unsigned)
  SUBJECTIVE:   CHIEF COMPLAINT / HPI:   Carrie Rivas is a 58 year old female who presents with headaches and dizziness.  She experiences intermittent headaches for the past month, lasting all day, located on the side of her head and behind her eyes. Bright light worsens the headaches. Tylenol  provides partial relief, but the headaches recur. She denies any history of migraines or similar headaches.  Dizziness occurs daily, described as a spinning sensation with a feeling of fever and heaviness in her head. The dizziness is transient. She denies falls or head trauma but feels unsteady and sits down when dizzy. Numbness in her face occurs with the headaches.  She has undergone cataract surgery and has glaucoma. She uses prescribed eye drops and had her last eye examination a month ago. She experiences pressure in her eyes.  Arabic video interpreter utilized during encounter.  Her daughter accompanies her during encounter.  PERTINENT  PMH / PSH: N/A  OBJECTIVE:  BP 123/87   Pulse 82   Ht 5\' 7"  (1.702 m)   Wt 165 lb 3.2 oz (74.9 kg)   LMP 10/19/2018 (Approximate)   SpO2 100%   BMI 25.87 kg/m  General: Well-appearing, NAD CV: RRR, no murmurs appreciable Pulm: CTAB, normal WOB Neuro: CN II through XII intact, no focal neurological deficits appreciable, gait appropriate, upper extremity strength 5/5 bilaterally, normal speech  ASSESSMENT/PLAN:   Assessment & Plan Intractable headache, unspecified chronicity pattern, unspecified headache type New onset headaches with accompanying dizziness and photophobia in age > 21.  This meets criteria for high risk features.  Blood pressure normal, neurological exam unremarkable.  Obtain CT head without contrast and BMP and CBC.  Recommend returning to ophthalmology for eye evaluation given history of glaucoma.  I do not suspect infectious cause at this time.  Pain decently controlled with Tylenol  at this time, continue conservative pain measures.   Return depending on imaging results. Return if symptoms worsen or fail to improve. Veronia Goon, DO 08/28/2023, 4:24 PM PGY-3, [facility]

## 2023-08-28 ENCOUNTER — Encounter: Payer: Self-pay | Admitting: Student

## 2023-08-28 ENCOUNTER — Encounter: Payer: Self-pay | Admitting: *Deleted

## 2023-08-28 ENCOUNTER — Ambulatory Visit (INDEPENDENT_AMBULATORY_CARE_PROVIDER_SITE_OTHER): Admitting: Student

## 2023-08-28 VITALS — BP 123/87 | HR 82 | Ht 67.0 in | Wt 165.2 lb

## 2023-08-28 DIAGNOSIS — R519 Headache, unspecified: Secondary | ICD-10-CM | POA: Diagnosis present

## 2023-08-28 NOTE — Patient Instructions (Addendum)
 It was great to see you today! Thank you for choosing [facility] for your primary care.  ??????? ????? ?? ???: 1. ???? ?? ????? ?????? ?????? ?????? ?????. ??????? ?????? ??? ?????? ???? ?????? ?????. ?? ??? ???????? ????? ????????? ?? ????? ????????? ?????? ????? ????? ????? ?????????? ???? ?? ???? ?? ???? ??????. tanawalna alyawm ma yali: 1. 'arghab fi 'iijra' fuhusat makhbariat wataswir lilraasa. sayatawasal fariquna maeak litahdid maweid litaswir alraasi. fi hadhih al'athna'i, [place] alaistimrar fi tanawul taylinul, [place] aydan tanawul 'uksid almaghnisium aladhi qad yufid fi eilaj alsidaei.  Today we addressed: I would like to get lab work and imaging of your head.  Our staff will reach out to regarding scheduling for the head imaging.  In the meantime you may continue taking Tylenol  and you may also take magnesium oxide which can be helpful for headaches.  If you haven't already, sign up for My Chart to have easy access to your labs results, and communication with your primary care physician. We are checking some labs today. If they are abnormal, I will call you. If they are normal, I will send you a MyChart message (if it is active) or a letter in the mail. If you do not hear about your labs in the next 2 weeks, please call the office. Return if symptoms worsen or fail to improve. Please arrive 15 minutes before your appointment to ensure smooth check in process.  We appreciate your efforts in making this happen.  Thank you for allowing me to participate in your care, Veronia Goon, DO 08/28/2023, 4:02 PM PGY-3, [facility]

## 2023-08-29 LAB — CBC
Hematocrit: 39.2 % (ref 34.0–46.6)
Hemoglobin: 12.8 g/dL (ref 11.1–15.9)
MCH: 29.4 pg (ref 26.6–33.0)
MCHC: 32.7 g/dL (ref 31.5–35.7)
MCV: 90 fL (ref 79–97)
Platelets: 288 10*3/uL (ref 150–450)
RBC: 4.35 x10E6/uL (ref 3.77–5.28)
RDW: 13.2 % (ref 11.7–15.4)
WBC: 4.7 10*3/uL (ref 3.4–10.8)

## 2023-08-29 LAB — COMPREHENSIVE METABOLIC PANEL WITH GFR
ALT: 13 IU/L (ref 0–32)
AST: 18 IU/L (ref 0–40)
Albumin: 4.3 g/dL (ref 3.8–4.9)
Alkaline Phosphatase: 117 IU/L (ref 44–121)
BUN/Creatinine Ratio: 13 (ref 9–23)
BUN: 9 mg/dL (ref 6–24)
Bilirubin Total: <0.2 mg/dL (ref 0.0–1.2)
CO2: 21 mmol/L (ref 20–29)
Calcium: 10 mg/dL (ref 8.7–10.2)
Chloride: 106 mmol/L (ref 96–106)
Creatinine, Ser: 0.7 mg/dL (ref 0.57–1.00)
Globulin, Total: 3 g/dL (ref 1.5–4.5)
Glucose: 100 mg/dL — ABNORMAL HIGH (ref 70–99)
Potassium: 4.1 mmol/L (ref 3.5–5.2)
Sodium: 141 mmol/L (ref 134–144)
Total Protein: 7.3 g/dL (ref 6.0–8.5)
eGFR: 101 mL/min/{1.73_m2} (ref >59)

## 2023-08-30 ENCOUNTER — Ambulatory Visit: Payer: Self-pay | Admitting: Student

## 2023-09-06 ENCOUNTER — Telehealth: Payer: Self-pay

## 2023-09-06 NOTE — Telephone Encounter (Signed)
 Left voice messsage for patient concerning Upcoming CT. Used [employer] # [id number].  CT is at [facility] 09/19/2023 1500 with a arrival of 1430  .Gerold Kos, CMA

## 2023-09-19 ENCOUNTER — Ambulatory Visit (HOSPITAL_COMMUNITY)

## 2023-10-04 DIAGNOSIS — M4326 Fusion of spine, lumbar region: Secondary | ICD-10-CM | POA: Insufficient documentation

## 2023-10-09 ENCOUNTER — Encounter: Payer: Self-pay | Admitting: Neurology

## 2023-10-09 ENCOUNTER — Ambulatory Visit: Admitting: Neurology
